# Patient Record
Sex: Female | Born: 1985 | Race: Black or African American | Hispanic: No | Smoking: Never smoker
Health system: Southern US, Community
[De-identification: ages and names within clinical notes are randomized; demographics above are authoritative.]

## PROBLEM LIST (undated history)

## (undated) ENCOUNTER — Inpatient Hospital Stay (HOSPITAL_COMMUNITY): Payer: Self-pay

## (undated) DIAGNOSIS — F32A Depression, unspecified: Secondary | ICD-10-CM

## (undated) DIAGNOSIS — F329 Major depressive disorder, single episode, unspecified: Secondary | ICD-10-CM

## (undated) DIAGNOSIS — F419 Anxiety disorder, unspecified: Secondary | ICD-10-CM

## (undated) DIAGNOSIS — D563 Thalassemia minor: Secondary | ICD-10-CM

## (undated) DIAGNOSIS — O139 Gestational [pregnancy-induced] hypertension without significant proteinuria, unspecified trimester: Secondary | ICD-10-CM

## (undated) DIAGNOSIS — J45909 Unspecified asthma, uncomplicated: Secondary | ICD-10-CM

## (undated) HISTORY — PX: WISDOM TOOTH EXTRACTION: SHX21

---

## 2010-11-21 ENCOUNTER — Inpatient Hospital Stay (INDEPENDENT_AMBULATORY_CARE_PROVIDER_SITE_OTHER)
Admission: RE | Admit: 2010-11-21 | Discharge: 2010-11-21 | Disposition: A | Discharge status: Home / Self Care | Payer: Self-pay | Source: Ambulatory Visit | Attending: Family Medicine | Admitting: Family Medicine

## 2010-11-21 DIAGNOSIS — B86 Scabies: Secondary | ICD-10-CM

## 2011-02-05 ENCOUNTER — Emergency Department (HOSPITAL_COMMUNITY)
Admission: EM | Admit: 2011-02-05 | Discharge: 2011-02-05 | Disposition: A | Discharge status: Home / Self Care | Payer: Self-pay | Attending: Emergency Medicine | Admitting: Emergency Medicine

## 2011-02-05 DIAGNOSIS — R42 Dizziness and giddiness: Secondary | ICD-10-CM | POA: Insufficient documentation

## 2011-02-05 DIAGNOSIS — R5381 Other malaise: Secondary | ICD-10-CM | POA: Insufficient documentation

## 2011-02-05 DIAGNOSIS — R002 Palpitations: Secondary | ICD-10-CM | POA: Insufficient documentation

## 2011-02-05 DIAGNOSIS — R55 Syncope and collapse: Secondary | ICD-10-CM | POA: Insufficient documentation

## 2011-02-05 DIAGNOSIS — R110 Nausea: Secondary | ICD-10-CM | POA: Insufficient documentation

## 2011-02-05 DIAGNOSIS — B9789 Other viral agents as the cause of diseases classified elsewhere: Secondary | ICD-10-CM | POA: Insufficient documentation

## 2011-02-05 DIAGNOSIS — R5383 Other fatigue: Secondary | ICD-10-CM | POA: Insufficient documentation

## 2011-02-05 DIAGNOSIS — R630 Anorexia: Secondary | ICD-10-CM | POA: Insufficient documentation

## 2011-02-05 LAB — URINALYSIS, ROUTINE W REFLEX MICROSCOPIC
Glucose, UA: NEGATIVE mg/dL
Ketones, ur: NEGATIVE mg/dL
Leukocytes, UA: NEGATIVE
pH: 7 (ref 5.0–8.0)

## 2011-02-05 LAB — DIFFERENTIAL
Basophils Relative: 1 % (ref 0–1)
Eosinophils Relative: 5 % (ref 0–5)
Lymphocytes Relative: 33 % (ref 12–46)
Monocytes Relative: 17 % — ABNORMAL HIGH (ref 3–12)
Neutrophils Relative %: 44 % (ref 43–77)

## 2011-02-05 LAB — CBC
HCT: 38.1 % (ref 36.0–46.0)
Hemoglobin: 13.1 g/dL (ref 12.0–15.0)
MCV: 65.8 fL — ABNORMAL LOW (ref 78.0–100.0)
Platelets: 284 10*3/uL (ref 150–400)
RBC: 5.79 MIL/uL — ABNORMAL HIGH (ref 3.87–5.11)
WBC: 3.1 10*3/uL — ABNORMAL LOW (ref 4.0–10.5)

## 2011-02-05 LAB — BASIC METABOLIC PANEL
BUN: 6 mg/dL (ref 6–23)
CO2: 29 mEq/L (ref 19–32)
Chloride: 103 mEq/L (ref 96–112)
Creatinine, Ser: 0.61 mg/dL (ref 0.50–1.10)

## 2011-02-06 LAB — URINE CULTURE: Culture  Setup Time: 201209250221

## 2013-10-29 ENCOUNTER — Emergency Department (HOSPITAL_BASED_OUTPATIENT_CLINIC_OR_DEPARTMENT_OTHER)
Admission: EM | Admit: 2013-10-29 | Discharge: 2013-10-29 | Disposition: A | Discharge status: Home / Self Care | Payer: 59 | Attending: Emergency Medicine | Admitting: Emergency Medicine

## 2013-10-29 ENCOUNTER — Encounter (HOSPITAL_BASED_OUTPATIENT_CLINIC_OR_DEPARTMENT_OTHER): Payer: Self-pay | Admitting: Emergency Medicine

## 2013-10-29 DIAGNOSIS — J45909 Unspecified asthma, uncomplicated: Secondary | ICD-10-CM | POA: Insufficient documentation

## 2013-10-29 DIAGNOSIS — K006 Disturbances in tooth eruption: Secondary | ICD-10-CM | POA: Insufficient documentation

## 2013-10-29 DIAGNOSIS — K029 Dental caries, unspecified: Secondary | ICD-10-CM

## 2013-10-29 HISTORY — DX: Unspecified asthma, uncomplicated: J45.909

## 2013-10-29 MED ORDER — PENICILLIN V POTASSIUM 250 MG PO TABS
500.0000 mg | ORAL_TABLET | Freq: Once | ORAL | Status: AC
  Administered 2013-10-29: 500 mg via ORAL
  Filled 2013-10-29: qty 2

## 2013-10-29 MED ORDER — TRAMADOL HCL 50 MG PO TABS
50.0000 mg | ORAL_TABLET | Freq: Four times a day (QID) | ORAL | Status: DC | PRN
Start: 2013-10-29 — End: 2017-04-12

## 2013-10-29 MED ORDER — PENICILLIN V POTASSIUM 500 MG PO TABS: 500.0000 mg | ORAL_TABLET | Freq: Four times a day (QID) | ORAL | Status: AC

## 2013-10-29 NOTE — ED Provider Notes (Signed)
CSN: [id number]     Arrival date & time 10/29/13  0143 History   First MD Initiated Contact with Patient 10/29/13 0242     Chief Complaint  Patient presents with  . Dental Pain     (Consider location/radiation/quality/duration/timing/severity/associated sxs/prior Treatment) Patient is a 28 y.o. female presenting with tooth pain. The history is provided by the patient.  Dental Pain Location:  Upper Upper teeth location:  1/RU 3rd molar Quality:  Dull Severity:  Severe Onset quality:  Gradual Timing:  Constant Progression:  Unchanged Chronicity:  New Context: dental caries and dental fracture   Previous work-up:  Dental exam Relieved by:  Nothing Worsened by:  Nothing tried Ineffective treatments:  None tried Associated symptoms: no fever and no neck swelling   Risk factors: no alcohol problem     Past Medical History  Diagnosis Date  . Asthma    History reviewed. No pertinent past surgical history. History reviewed. No pertinent family history. History  Substance Use Topics  . Smoking status: Never Smoker   . Smokeless tobacco: Not on file  . Alcohol Use: No   OB History   Grav Para Term Preterm Abortions TAB SAB Ect Mult Living                 Review of Systems  Constitutional: Negative for fever.  All other systems reviewed and are negative.     Allergies  Review of patient's allergies indicates no known allergies.  Home Medications   Prior to Admission medications   Medication Sig Start Date End Date Taking? Authorizing Provider  penicillin v potassium (VEETID) 500 MG tablet Take 1 tablet (500 mg total) by mouth 4 (four) times daily. 10/29/13 11/05/13  Adilynn Bessey K Shaheim Mahar-Rasch, MD  traMADol (ULTRAM) 50 MG tablet Take 1 tablet (50 mg total) by mouth every 6 (six) hours as needed. 10/29/13   Yussef Jorge K Tanganyika Bowlds-Rasch, MD   BP 102/81  Pulse 70  Temp(Src) 98.6 F (37 C) (Oral)  Resp 17  Ht 5\' 6"  (1.676 m)  Wt 134 lb (60.782 kg)  BMI 21.64 kg/m2  SpO2  100% Physical Exam  Constitutional: She is oriented to person, place, and time. She appears well-developed and well-nourished. No distress.  HENT:  Head: Normocephalic and atraumatic.  Mouth/Throat: Oropharynx is clear and moist. No trismus in the jaw. Abnormal dentition. Dental caries present. No uvula swelling.    Eyes: Conjunctivae are normal. Pupils are equal, round, and reactive to light.  Neck: Normal range of motion. Neck supple.  Cardiovascular: Normal rate and regular rhythm.   Pulmonary/Chest: Effort normal and breath sounds normal. No stridor. She has no wheezes. She has no rales.  Abdominal: Soft. Bowel sounds are normal.  Musculoskeletal: Normal range of motion.  Neurological: She is alert and oriented to person, place, and time.  Skin: Skin is warm and dry.  Psychiatric: She has a normal mood and affect.    ED Course  Procedures (including critical care time) Labs Review Labs Reviewed - No data to display  Imaging Review No results found.   EKG Interpretation None      MDM   Final diagnoses:  Dental caries    Will need extractions or wisdom teeth and repair of molars.  Follow up with oral surgery.  Take all antibiotics.  Pain medication given   Andersyn Fragoso K Bassam Dresch-Rasch, MD 10/29/13 [phone]

## 2013-10-29 NOTE — Discharge Instructions (Signed)
Dental Care and Dentist Visits  Dental care supports good overall health. Regular dental visits can also help you avoid dental pain, bleeding, infection, and other more serious health problems in the future. It is important to keep the mouth healthy because diseases in the teeth, gums, and other oral tissues can spread to other areas of the body. Some problems, such as diabetes, heart disease, and pre-term labor have been associated with poor oral health.   See your dentist every 6 months. If you experience emergency problems such as a toothache or broken tooth, go to the dentist right away. If you see your dentist regularly, you may catch problems early. It is easier to be treated for problems in the early stages.   WHAT TO EXPECT AT A DENTIST VISIT   Your dentist will look for many common oral health problems and recommend proper treatment. At your regular dental visit, you can expect:  · Gentle cleaning of the teeth and gums. This includes scraping and polishing. This helps to remove the sticky substance around the teeth and gums (plaque). Plaque forms in the mouth shortly after eating. Over time, plaque hardens on the teeth as tartar. If tartar is not removed regularly, it can cause problems. Cleaning also helps remove stains.  · Periodic X-rays. These pictures of the teeth and supporting bone will help your dentist assess the health of your teeth.  · Periodic fluoride treatments. Fluoride is a natural mineral shown to help strengthen teeth. Fluoride treatment involves applying a fluoride gel or varnish to the teeth. It is most commonly done in children.  · Examination of the mouth, tongue, jaws, teeth, and gums to look for any oral health problems, such as:  ¨ Cavities (dental caries). This is decay on the tooth caused by plaque, sugar, and acid in the mouth. It is best to catch a cavity when it is small.  ¨ Inflammation of the gums caused by plaque buildup (gingivitis).  ¨ Problems with the mouth or malformed  or misaligned teeth.  ¨ Oral cancer or other diseases of the soft tissues or jaws.   KEEP YOUR TEETH AND GUMS HEALTHY  For healthy teeth and gums, follow these general guidelines as well as your dentist's specific advice:  · Have your teeth professionally cleaned at the dentist every 6 months.  · Brush twice daily with a fluoride toothpaste.  · Floss your teeth daily.   · Ask your dentist if you need fluoride supplements, treatments, or fluoride toothpaste.  · Eat a healthy diet. Reduce foods and drinks with added sugar.  · Avoid smoking.  TREATMENT FOR ORAL HEALTH PROBLEMS  If you have oral health problems, treatment varies depending on the conditions present in your teeth and gums.  · Your caregiver will most likely recommend good oral hygiene at each visit.  · For cavities, gingivitis, or other oral health disease, your caregiver will perform a procedure to treat the problem. This is typically done at a separate appointment. Sometimes your caregiver will refer you to another dental specialist for specific tooth problems or for surgery.  SEEK IMMEDIATE DENTAL CARE IF:  · You have pain, bleeding, or soreness in the gum, tooth, jaw, or mouth area.  · A permanent tooth becomes loose or separated from the gum socket.  · You experience a blow or injury to the mouth or jaw area.  Document Released: 01/10/2011 Document Revised: 07/23/2011 Document Reviewed: 01/10/2011  ExitCare® Patient Information ©2015 [employer]. This information is not intended to replace advice   given to you by your health care provider. Make sure you discuss any questions you have with your health care provider.

## 2013-10-29 NOTE — ED Notes (Signed)
Dental pain for several days

## 2015-01-31 DIAGNOSIS — M2550 Pain in unspecified joint: Secondary | ICD-10-CM | POA: Insufficient documentation

## 2015-01-31 DIAGNOSIS — F319 Bipolar disorder, unspecified: Secondary | ICD-10-CM | POA: Insufficient documentation

## 2017-04-12 ENCOUNTER — Ambulatory Visit (INDEPENDENT_AMBULATORY_CARE_PROVIDER_SITE_OTHER): Payer: 59 | Admitting: Family Medicine

## 2017-04-12 ENCOUNTER — Encounter: Payer: Self-pay | Admitting: Family Medicine

## 2017-04-12 VITALS — BP 136/80 | HR 83 | Wt 135.0 lb

## 2017-04-12 DIAGNOSIS — Z3401 Encounter for supervision of normal first pregnancy, first trimester: Secondary | ICD-10-CM

## 2017-04-12 DIAGNOSIS — Z113 Encounter for screening for infections with a predominantly sexual mode of transmission: Secondary | ICD-10-CM | POA: Diagnosis not present

## 2017-04-12 DIAGNOSIS — Z3400 Encounter for supervision of normal first pregnancy, unspecified trimester: Secondary | ICD-10-CM

## 2017-04-12 NOTE — Progress Notes (Signed)
  Subjective:  Hailey FosterDominique Mckenzie is a G1P0 8344w2d being seen today for her first obstetrical visit.  Her obstetrical history is insignificant. FOB is involved and patient's boyfriend. Patient does intend to breast feed. Pregnancy history fully reviewed.  Patient reports right arm tingling, left hip pain..  BP 136/80   Pulse 83   Wt 135 lb (61.2 kg)   LMP 01/23/2017 (Exact Date)   BMI 21.79 kg/m   HISTORY: OB History  Gravida Para Term Preterm AB Living  1            SAB TAB Ectopic Multiple Live Births               # Outcome Date GA Lbr Len/2nd Weight Sex Delivery Anes PTL Lv  1 Current               Past Medical History:  Diagnosis Date  . Asthma     Past Surgical History:  Procedure Laterality Date  . WISDOM TOOTH EXTRACTION      Family History  Problem Relation Age of Onset  . Hypertension Mother   . Asthma Father   . Hypertension Maternal Grandmother   . Cancer Neg Hx   . Diabetes Neg Hx   . Stroke Neg Hx      Exam    Uterus:     Pelvic Exam:    Perineum: No Hemorrhoids, Normal Perineum   Vulva: normal, Bartholin's, Urethra, Skene's normal   Vagina:  normal mucosa   Cervix: no cervical motion tenderness, no lesions and nulliparous appearance   Adnexa: normal adnexa and no mass, fullness, tenderness   Bony Pelvis: gynecoid  System: Breast:  normal appearance, no masses or tenderness, Inspection negative, No nipple retraction or dimpling, No nipple discharge or bleeding, No axillary or supraclavicular adenopathy, Normal to palpation without dominant masses   Skin: normal coloration and turgor, no rashes    Neurologic: gait normal; reflexes normal and symmetric   Extremities: normal strength, tone, and muscle mass, no deformities, no erythema, induration, or nodules   HEENT PERRLA and extra ocular movement intact   Mouth/Teeth mucous membranes moist, pharynx normal without lesions   Neck supple and no masses   Cardiovascular: regular rate and rhythm, no  murmurs or gallops   Respiratory:  appears well, vitals normal, no respiratory distress, acyanotic, normal RR, ear and throat exam is normal, neck free of mass or lymphadenopathy, chest clear, no wheezing, crepitations, rhonchi, normal symmetric air entry   Abdomen: soft, non-tender; bowel sounds normal; no masses,  no organomegaly   Urinary: urethral meatus normal      Assessment:    Pregnancy: G1P0 Patient Active Problem List   Diagnosis Date Noted  . Supervision of normal first pregnancy, antepartum 04/12/2017      Plan:   1. Supervision of normal first pregnancy, antepartum Genetic Screening discussed Quad Screen: undecided.  Ultrasound discussed; fetal survey: ordered.  Follow up in 8 weeks.   Problem list reviewed and updated. 75% of 30 min visit spent on counseling and coordination of care.     Levie HeritageJacob J Gerhard Rappaport 04/12/2017

## 2017-04-12 NOTE — Progress Notes (Signed)
DATING AND VIABILITY SONOGRAM   Hailey FosterDominique Mckenzie is a 31 y.o. year old G1P0 with LMP Patient's last menstrual period was 01/23/2017 (exact date). which would correlate to  5645w2d weeks gestation.  She has regular menstrual cycles.   She is here today for a confirmatory initial sonogram.    GESTATION: SINGLETON     FETAL ACTIVITY:          Heart rate         156          The fetus is active.  PLACENTA LOCALIZATION:  anterior   GESTATIONAL AGE AND  BIOMETRICS:  Gestational criteria: Estimated Date of Delivery: 10/30/17 by LMP now at 4445w2d  Previous Scans:0      Crown RUMP 4.37 cm 11-1 weeks                                                                               AVERAGE EGA(BY THIS SCAN):11-1 weeks  WORKING EDD( early ultrasound ):  10-30-17  Hailey StammerJennifer Clemens Lachman RN BSN

## 2017-04-13 LAB — OBSTETRIC PANEL, INCLUDING HIV
ANTIBODY SCREEN: NEGATIVE
BASOS: 0 %
Basophils Absolute: 0 10*3/uL (ref 0.0–0.2)
EOS (ABSOLUTE): 0.1 10*3/uL (ref 0.0–0.4)
Eos: 2 %
HEMATOCRIT: 33.6 % — AB (ref 34.0–46.6)
HEMOGLOBIN: 11.4 g/dL (ref 11.1–15.9)
HEP B S AG: NEGATIVE
HIV Screen 4th Generation wRfx: NONREACTIVE
IMMATURE GRANULOCYTES: 0 %
Immature Grans (Abs): 0 10*3/uL (ref 0.0–0.1)
LYMPHS: 22 %
Lymphocytes Absolute: 1.4 10*3/uL (ref 0.7–3.1)
MCH: 22.1 pg — ABNORMAL LOW (ref 26.6–33.0)
MCHC: 33.9 g/dL (ref 31.5–35.7)
MCV: 65 fL — ABNORMAL LOW (ref 79–97)
MONOCYTES: 10 %
Monocytes Absolute: 0.6 10*3/uL (ref 0.1–0.9)
NEUTROS PCT: 66 %
Neutrophils Absolute: 4 10*3/uL (ref 1.4–7.0)
Platelets: 262 10*3/uL (ref 150–379)
RBC: 5.17 x10E6/uL (ref 3.77–5.28)
RDW: 16.7 % — ABNORMAL HIGH (ref 12.3–15.4)
RH TYPE: POSITIVE
RPR: NONREACTIVE
RUBELLA: 9.43 {index} (ref >0.99)
WBC: 6.1 10*3/uL (ref 3.4–10.8)

## 2017-04-13 LAB — VITAMIN D 25 HYDROXY (VIT D DEFICIENCY, FRACTURES): Vit D, 25-Hydroxy: 8.2 ng/mL — ABNORMAL LOW (ref 30.0–100.0)

## 2017-04-14 LAB — URINE CULTURE: Organism ID, Bacteria: NO GROWTH

## 2017-04-15 ENCOUNTER — Other Ambulatory Visit: Payer: Self-pay | Admitting: Family Medicine

## 2017-04-15 ENCOUNTER — Telehealth: Payer: Self-pay

## 2017-04-15 DIAGNOSIS — Z3400 Encounter for supervision of normal first pregnancy, unspecified trimester: Secondary | ICD-10-CM

## 2017-04-15 MED ORDER — VITAMIN D (ERGOCALCIFEROL) 1.25 MG (50000 UNIT) PO CAPS: 50000.0000 [IU] | ORAL_CAPSULE | ORAL | 0 refills | Status: DC

## 2017-04-15 NOTE — Telephone Encounter (Signed)
Attempted to reach patient to make her aware of Vitamin D result and order from Dr. Adrian Blackwaterstinson Vitamin D 50,000 units.   Unable to reach patient due to busy signal. Armandina StammerJennifer Kijana Estock RNBSN

## 2017-04-15 NOTE — Progress Notes (Signed)
Patient has extremely low vitamin D levels. Supplementation recommended: 50,000 IU once a week. No pharmacy listed. Please call patient and send prescription.

## 2017-04-16 LAB — GC/CHLAMYDIA PROBE AMP (~~LOC~~) NOT AT ARMC
CHLAMYDIA, DNA PROBE: NEGATIVE
Neisseria Gonorrhea: NEGATIVE

## 2017-05-29 ENCOUNTER — Encounter (HOSPITAL_COMMUNITY): Payer: Self-pay | Admitting: Family Medicine

## 2017-06-05 ENCOUNTER — Ambulatory Visit (HOSPITAL_COMMUNITY): Payer: 59

## 2017-06-07 ENCOUNTER — Ambulatory Visit (INDEPENDENT_AMBULATORY_CARE_PROVIDER_SITE_OTHER): Payer: 59 | Admitting: Family Medicine

## 2017-06-07 VITALS — BP 122/75 | HR 101 | Wt 136.0 lb

## 2017-06-07 DIAGNOSIS — Z3490 Encounter for supervision of normal pregnancy, unspecified, unspecified trimester: Secondary | ICD-10-CM

## 2017-06-07 DIAGNOSIS — Z3400 Encounter for supervision of normal first pregnancy, unspecified trimester: Secondary | ICD-10-CM

## 2017-06-07 MED ORDER — VITAMIN D (ERGOCALCIFEROL) 1.25 MG (50000 UNIT) PO CAPS: 50000.0000 [IU] | ORAL_CAPSULE | ORAL | 0 refills | Status: DC

## 2017-06-07 NOTE — Patient Instructions (Signed)

## 2017-06-07 NOTE — Progress Notes (Signed)
   PRENATAL VISIT NOTE  Subjective:  Hailey Mckenzie is a 32 y.o. G1P0 at 7082w2d being seen today for ongoing prenatal care.  She is currently monitored for the following issues for this low-risk pregnancy and has Supervision of normal first pregnancy, antepartum on their problem list.  Patient reports sinus congestion starting a couple days ago..  Contractions: Not present. Vag. Bleeding: None.   . Denies leaking of fluid.   The following portions of the patient's history were reviewed and updated as appropriate: allergies, current medications, past family history, past medical history, past social history, past surgical history and problem list. Problem list updated.  Objective:   Vitals:   06/07/17 0952  BP: 122/75  Pulse: (!) 101  Weight: 136 lb (61.7 kg)    Fetal Status: Fetal Heart Rate (bpm): 152         General:  Alert, oriented and cooperative. Patient is in no acute distress.  Skin: Skin is warm and dry. No rash noted.   Cardiovascular: Normal heart rate noted  Respiratory: Normal respiratory effort, no problems with respiration noted  Abdomen: Soft, gravid, appropriate for gestational age.  Pain/Pressure: Absent     Pelvic: Cervical exam deferred        Extremities: Normal range of motion.  Edema: None  Mental Status:  Normal mood and affect. Normal behavior. Normal judgment and thought content.   Assessment and Plan:  Pregnancy: G1P0 at 5582w2d  1. Supervision of normal first pregnancy, antepartum FHT and FH normal. US Tuesday.  A list of OTC cold meds given.  2. Prenatal care, antepartum  - Babyscripts Schedule Optimization  Preterm labor symptoms and general obstetric precautions including but not limited to vaginal bleeding, contractions, leaking of fluid and fetal movement were reviewed in detail with the patient. Please refer to After Visit Summary for other counseling recommendations.  Return in about 8 weeks (around 08/02/2017).   Levie HeritageJacob J Vollie Aaron, DO

## 2017-06-07 NOTE — Addendum Note (Signed)
Addended by: Anell BarrHOWARD, JENNIFER L on: 06/07/2017 10:44 AM   Modules accepted: Orders

## 2017-06-07 NOTE — Addendum Note (Signed)
Addended by: Levie HeritageSTINSON, Ryna Beckstrom J on: 06/07/2017 10:37 AM   Modules accepted: Orders

## 2017-06-11 ENCOUNTER — Other Ambulatory Visit: Payer: Self-pay | Admitting: Family Medicine

## 2017-06-11 ENCOUNTER — Ambulatory Visit (HOSPITAL_COMMUNITY)
Admission: RE | Admit: 2017-06-11 | Discharge: 2017-06-11 | Disposition: A | Discharge status: Home / Self Care | Payer: 59 | Source: Ambulatory Visit | Attending: Family Medicine | Admitting: Family Medicine

## 2017-06-11 DIAGNOSIS — Z3A19 19 weeks gestation of pregnancy: Secondary | ICD-10-CM

## 2017-06-11 DIAGNOSIS — Z363 Encounter for antenatal screening for malformations: Secondary | ICD-10-CM

## 2017-06-11 DIAGNOSIS — Z3400 Encounter for supervision of normal first pregnancy, unspecified trimester: Secondary | ICD-10-CM

## 2017-08-02 ENCOUNTER — Ambulatory Visit (INDEPENDENT_AMBULATORY_CARE_PROVIDER_SITE_OTHER): Payer: 59 | Admitting: Family Medicine

## 2017-08-02 VITALS — BP 97/53 | HR 66 | Wt 141.0 lb

## 2017-08-02 DIAGNOSIS — Z3400 Encounter for supervision of normal first pregnancy, unspecified trimester: Secondary | ICD-10-CM

## 2017-08-02 NOTE — Progress Notes (Signed)
   PRENATAL VISIT NOTE  Subjective:  Hailey Mckenzie is a 32 y.o. G1P0 at 1664w2d being seen today for ongoing prenatal care.  She is currently monitored for the following issues for this low-risk pregnancy and has Supervision of normal first pregnancy, antepartum on their problem list.  Patient reports no complaints.  Contractions: Not present. Vag. Bleeding: None.  Movement: Present. Denies leaking of fluid.   The following portions of the patient's history were reviewed and updated as appropriate: allergies, current medications, past family history, past medical history, past social history, past surgical history and problem list. Problem list updated.  Objective:   Vitals:   08/02/17 1030  BP: (!) 97/53  Pulse: 66  Weight: 141 lb (64 kg)    Fetal Status: Fetal Heart Rate (bpm): 155 Fundal Height: 28 cm Movement: Present     General:  Alert, oriented and cooperative. Patient is in no acute distress.  Skin: Skin is warm and dry. No rash noted.   Cardiovascular: Normal heart rate noted  Respiratory: Normal respiratory effort, no problems with respiration noted  Abdomen: Soft, gravid, appropriate for gestational age.  Pain/Pressure: Absent     Pelvic: Cervical exam deferred        Extremities: Normal range of motion.     Mental Status:  Normal mood and affect. Normal behavior. Normal judgment and thought content.   Assessment and Plan:  Pregnancy: G1P0 at 8464w2d  1. Supervision of normal first pregnancy, antepartum F/u US for heart views. Return next week ([place]) for 28 wk labs. - US MFM OB FOLLOW UP; Future  Preterm labor symptoms and general obstetric precautions including but not limited to vaginal bleeding, contractions, leaking of fluid and fetal movement were reviewed in detail with the patient. Please refer to After Visit Summary for other counseling recommendations.  Return in about 1 month (around 08/30/2017) for OB f/u.   Levie HeritageJacob J Stinson, DO

## 2017-08-07 ENCOUNTER — Other Ambulatory Visit: Payer: 59

## 2017-08-07 DIAGNOSIS — Z3490 Encounter for supervision of normal pregnancy, unspecified, unspecified trimester: Secondary | ICD-10-CM

## 2017-08-07 NOTE — Progress Notes (Signed)
Patient for lab only visit. Sent to lab. Armandina StammerJennifer Kiosha Buchan RNBSN

## 2017-08-08 ENCOUNTER — Encounter: Payer: Self-pay | Admitting: Family Medicine

## 2017-08-08 LAB — GLUCOSE TOLERANCE, 2 HOURS W/ 1HR
GLUCOSE, 1 HOUR: 124 mg/dL (ref 65–179)
GLUCOSE, 2 HOUR: 98 mg/dL (ref 65–152)
Glucose, Fasting: 78 mg/dL (ref 65–91)

## 2017-08-08 LAB — CBC
Hematocrit: 34.5 % (ref 34.0–46.6)
Hemoglobin: 11.4 g/dL (ref 11.1–15.9)
MCH: 23.1 pg — ABNORMAL LOW (ref 26.6–33.0)
MCHC: 33 g/dL (ref 31.5–35.7)
MCV: 70 fL — ABNORMAL LOW (ref 79–97)
Platelets: 218 10*3/uL (ref 150–379)
RBC: 4.93 x10E6/uL (ref 3.77–5.28)
RDW: 16.3 % — ABNORMAL HIGH (ref 12.3–15.4)
WBC: 7.2 10*3/uL (ref 3.4–10.8)

## 2017-08-08 LAB — RPR: RPR Ser Ql: NONREACTIVE

## 2017-08-08 LAB — HIV ANTIBODY (ROUTINE TESTING W REFLEX): HIV SCREEN 4TH GENERATION: NONREACTIVE

## 2017-08-15 ENCOUNTER — Ambulatory Visit (HOSPITAL_COMMUNITY): Payer: 59

## 2017-09-03 ENCOUNTER — Inpatient Hospital Stay (HOSPITAL_COMMUNITY)
Admission: AD | Admit: 2017-09-03 | Discharge: 2017-09-03 | Disposition: A | Discharge status: Home / Self Care | Payer: 59 | Source: Ambulatory Visit | Attending: Obstetrics & Gynecology | Admitting: Obstetrics & Gynecology

## 2017-09-03 ENCOUNTER — Encounter (HOSPITAL_COMMUNITY): Payer: Self-pay

## 2017-09-03 ENCOUNTER — Ambulatory Visit (INDEPENDENT_AMBULATORY_CARE_PROVIDER_SITE_OTHER): Payer: 59 | Admitting: Advanced Practice Midwife

## 2017-09-03 ENCOUNTER — Ambulatory Visit: Payer: 59

## 2017-09-03 VITALS — BP 130/88 | HR 79 | Wt 150.0 lb

## 2017-09-03 DIAGNOSIS — O3433 Maternal care for cervical incompetence, third trimester: Secondary | ICD-10-CM

## 2017-09-03 DIAGNOSIS — R109 Unspecified abdominal pain: Secondary | ICD-10-CM

## 2017-09-03 DIAGNOSIS — J45909 Unspecified asthma, uncomplicated: Secondary | ICD-10-CM | POA: Diagnosis not present

## 2017-09-03 DIAGNOSIS — Z3A31 31 weeks gestation of pregnancy: Secondary | ICD-10-CM | POA: Insufficient documentation

## 2017-09-03 DIAGNOSIS — O26893 Other specified pregnancy related conditions, third trimester: Secondary | ICD-10-CM

## 2017-09-03 DIAGNOSIS — O99513 Diseases of the respiratory system complicating pregnancy, third trimester: Secondary | ICD-10-CM | POA: Diagnosis not present

## 2017-09-03 DIAGNOSIS — Z3400 Encounter for supervision of normal first pregnancy, unspecified trimester: Secondary | ICD-10-CM

## 2017-09-03 HISTORY — DX: Thalassemia minor: D56.3

## 2017-09-03 LAB — URINALYSIS, ROUTINE W REFLEX MICROSCOPIC
Bilirubin Urine: NEGATIVE
Glucose, UA: NEGATIVE mg/dL
Hgb urine dipstick: NEGATIVE
KETONES UR: 5 mg/dL — AB
Nitrite: NEGATIVE
PH: 5 (ref 5.0–8.0)
Protein, ur: NEGATIVE mg/dL
Specific Gravity, Urine: 1.028 (ref 1.005–1.030)

## 2017-09-03 MED ORDER — BETAMETHASONE SOD PHOS & ACET 6 (3-3) MG/ML IJ SUSP
12.0000 mg | Freq: Once | INTRAMUSCULAR | Status: AC
  Administered 2017-09-03: 12 mg via INTRAMUSCULAR
  Filled 2017-09-03: qty 2

## 2017-09-03 MED ORDER — LACTATED RINGERS IV BOLUS: 1000.0000 mL | Freq: Once | INTRAVENOUS | Status: DC

## 2017-09-03 NOTE — MAU Note (Addendum)
Pt sent from office for further monitoring. Pt feeling pressure.

## 2017-09-03 NOTE — Progress Notes (Signed)
   PRENATAL VISIT NOTE  Subjective:  Linward FosterDominique Groft is a 32 y.o. G1P0 at 7864w6d being seen today for ongoing prenatal care.  She is currently monitored for the following issues for this low-risk pregnancy and has Supervision of normal first pregnancy, antepartum on their problem list.  Patient reports backache and contractions since 09/01/17.  Contractions: Not present. Vag. Bleeding: None.  Movement: Present. Denies leaking of fluid.   The following portions of the patient's history were reviewed and updated as appropriate: allergies, current medications, past family history, past medical history, past social history, past surgical history and problem list. Problem list updated.  Objective:   Vitals:   09/03/17 1007  BP: 130/88  Pulse: 79  Weight: 150 lb (68 kg)    Fetal Status: Fetal Heart Rate (bpm): 155   Movement: Present  Presentation: Vertex  General:  Alert, oriented and cooperative. Patient is in no acute distress.  Skin: Skin is warm and dry. No rash noted.   Cardiovascular: Normal heart rate noted  Respiratory: Normal respiratory effort, no problems with respiration noted  Abdomen: Soft, gravid, appropriate for gestational age.  Pain/Pressure: Present     Pelvic: Cervical exam performed Dilation: 1 Effacement (%): 50 Station: -3  Extremities: Normal range of motion.  Edema: None  Mental Status: Normal mood and affect. Normal behavior. Normal judgment and thought content.   Assessment and Plan:  Pregnancy: G1P0 at 4764w6d  1. Supervision of normal first pregnancy, antepartum --Reviewed anatomy US with recommended follow up for cardiac/outflow tracts --Pt declines f/u US due to cost with her insurance, all other anatomy wnl  2. Abdominal pain during pregnancy in third trimester --Pt reports cramping and lower abdominal aching x 2 days. She stayed in bed all day yesterday due to pain.  She has tried drinking water, resting, but pain persists.  It is not regular but  generalized and uncomfortable. --Cervix 1/50/-3 --[facility] evaluation of threatened preterm labor    Preterm labor symptoms and general obstetric precautions including but not limited to vaginal bleeding, contractions, leaking of fluid and fetal movement were reviewed in detail with the patient. Please refer to After Visit Summary for other counseling recommendations.  Return in about 2 weeks (around 09/17/2017).  Future Appointments  Date Time Provider Department Center  09/17/2017 10:45 AM Dorathy KinsmanSmith, [place], CNM [facility] None    Sharen CounterLisa Leftwich-Kirby, [place]

## 2017-09-03 NOTE — Patient Instructions (Signed)

## 2017-09-03 NOTE — MAU Note (Signed)
Pt give pitcher of water to PO hydrate

## 2017-09-03 NOTE — MAU Note (Signed)
Urine in lab 

## 2017-09-03 NOTE — Progress Notes (Signed)
Pt c/o lower abdominal pain

## 2017-09-03 NOTE — MAU Provider Note (Signed)
History     CSN: [id number]  Arrival date and time: 09/03/17 1142   First Provider Initiated Contact with Patient 09/03/17 1221      Chief Complaint  Patient presents with  . Abdominal Pain   HPI [name] is a 32 y.o. G1P0 at 329w6d who presents to MAU today from the office for further evaluation of possible preterm labor. The patient states she has been having dull, cramping pain for a couple of days that was improved today. She was checked at her routine prenatal visit and found to be 1/50%. She denies vaginal bleeding, LOF. She reports normal fetal movement.   OB History    Gravida  1   Para      Term      Preterm      AB      Living        SAB      TAB      Ectopic      Multiple      Live Births              Past Medical History:  Diagnosis Date  . Asthma   . Thalassemia trait     Past Surgical History:  Procedure Laterality Date  . WISDOM TOOTH EXTRACTION      Family History  Problem Relation Age of Onset  . Hypertension Mother   . Asthma Father   . Hypertension Maternal Grandmother   . Cancer Neg Hx   . Diabetes Neg Hx   . Stroke Neg Hx     Social History   Tobacco Use  . Smoking status: Never Smoker  . Smokeless tobacco: Never Used  Substance Use Topics  . Alcohol use: Yes    Alcohol/week: 0.6 oz    Types: 1 Glasses of wine per week  . Drug use: No    Allergies: No Known Allergies  No medications prior to admission.    Review of Systems  Constitutional: Negative for fever.  Gastrointestinal: Positive for abdominal pain. Negative for constipation, diarrhea, nausea and vomiting.  Genitourinary: Negative for vaginal bleeding and vaginal discharge.   Physical Exam   Blood pressure 122/90, pulse 79, temperature 98.4 F (36.9 C), temperature source Oral, resp. rate 16, weight 151 lb (68.5 kg), last menstrual period 01/23/2017, SpO2 99 %.  Physical Exam  Nursing note and vitals reviewed. Constitutional: She is  oriented to person, place, and time. She appears well-developed and well-nourished. No distress.  HENT:  Head: Normocephalic and atraumatic.  Cardiovascular: Normal rate.  Respiratory: Effort normal.  GI: Soft. She exhibits no distension and no mass. There is no tenderness. There is no rebound and no guarding.  Neurological: She is alert and oriented to person, place, and time.  Skin: Skin is warm and dry. No erythema.  Psychiatric: She has a normal mood and affect.  Dilation: 1.5 Effacement (%): 50 Station: -3 Presentation: Vertex Exam by:: Harlon FlorJ Danise Dehne NP   Results for orders placed or performed during the hospital encounter of 09/03/17 (from the past 24 hour(s))  Urinalysis, Routine w reflex microscopic     Status: Abnormal   Collection Time: 09/03/17 11:48 AM  Result Value Ref Range   Color, Urine YELLOW YELLOW   APPearance HAZY (A) CLEAR   Specific Gravity, Urine 1.028 1.005 - 1.030   pH 5.0 5.0 - 8.0   Glucose, UA NEGATIVE NEGATIVE mg/dL   Hgb urine dipstick NEGATIVE NEGATIVE   Bilirubin Urine NEGATIVE NEGATIVE  Ketones, ur 5 (A) NEGATIVE mg/dL   Protein, ur NEGATIVE NEGATIVE mg/dL   Nitrite NEGATIVE NEGATIVE   Leukocytes, UA SMALL (A) NEGATIVE   RBC / HPF 0-5 0 - 5 RBC/hpf   WBC, UA 6-10 0 - 5 WBC/hpf   Bacteria, UA RARE (A) NONE SEEN   Squamous Epithelial / LPF 11-20 0 - 5   Mucus PRESENT    Ca Oxalate Crys, UA PRESENT    Fetal Monitoring: Baseline: 120 bpm Variability: moderate Accelerations: 10 x 10 Decelerations: none Contractions: none   MAU Course  Procedures None  MDM CNM called from the office. Patient having cramping for a few days. No FFN available. Checked in the office 1/50 posterior.  No contractions noted on TOCO. Cervix unchanged after 1 hour. BMZ given today. Will return to [facility] for second dose tomorrow.   Assessment and Plan  A: SIUP at [redacted]w[redacted]d Premature cervical dilation   P: Discharge home Preterm labor precautions discussed Patient  advised to follow-up with [facility] for 2nd BMZ tomorrow afternoon and then return to [facility] for routine prenatal care Patient may return to MAU as needed or if her condition were to change or worsen  Vonzella Nipple, PA-C 09/03/2017, 2:49 PM

## 2017-09-03 NOTE — Discharge Instructions (Signed)
Pelvic Rest °Pelvic rest may be recommended if: °· Your placenta is partially or completely covering the opening of your cervix (placenta previa). °· There is bleeding between the wall of the uterus and the amniotic sac in the first trimester of pregnancy (subchorionic hemorrhage). °· You went into labor too early (preterm labor). ° °Based on your overall health and the health of your baby, your health care provider will decide if pelvic rest is right for you. °How do I rest my pelvis? °For as long as told by your health care provider: °· Do not have sex, sexual stimulation, or an orgasm. °· Do not use tampons. Do not douche. Do not put anything in your vagina. °· Do not lift anything that is heavier than 10 lb (4.5 kg). °· Avoid activities that take a lot of effort (are strenuous). °· Avoid any activity in which your pelvic muscles could become strained. ° °When should I seek medical care? °Seek medical care if you have: °· Cramping pain in your lower abdomen. °· Vaginal discharge. °· A low, dull backache. °· Regular contractions. °· Uterine tightening. ° °When should I seek immediate medical care? °Seek immediate medical care if: °· You have vaginal bleeding and you are pregnant. ° °This information is not intended to replace advice given to you by your health care provider. Make sure you discuss any questions you have with your health care provider. °Document Released: 08/25/2010 Document Revised: 10/06/2015 Document Reviewed: 11/01/2014 °Elsevier Interactive Patient Education © 2018 [employer] ° °

## 2017-09-04 ENCOUNTER — Ambulatory Visit (INDEPENDENT_AMBULATORY_CARE_PROVIDER_SITE_OTHER): Payer: 59 | Admitting: General Practice

## 2017-09-04 MED ORDER — BETAMETHASONE SOD PHOS & ACET 6 (3-3) MG/ML IJ SUSP
12.0000 mg | Freq: Once | INTRAMUSCULAR | Status: AC
  Administered 2017-09-04: 12 mg via INTRAMUSCULAR

## 2017-09-04 NOTE — Progress Notes (Signed)
Chart reviewed for nurse visit. Agree with plan of care.   Judeth HornLawrence, Pardeep Pautz, NP 09/04/2017 5:00 PM

## 2017-09-11 ENCOUNTER — Inpatient Hospital Stay (HOSPITAL_COMMUNITY)
Admission: AD | Admit: 2017-09-11 | Discharge: 2017-09-12 | Disposition: A | Discharge status: Home / Self Care | Payer: 59 | Source: Ambulatory Visit | Attending: Obstetrics & Gynecology | Admitting: Obstetrics & Gynecology

## 2017-09-11 ENCOUNTER — Encounter: Payer: Self-pay | Admitting: Family Medicine

## 2017-09-11 DIAGNOSIS — R030 Elevated blood-pressure reading, without diagnosis of hypertension: Secondary | ICD-10-CM | POA: Insufficient documentation

## 2017-09-11 DIAGNOSIS — Z8669 Personal history of other diseases of the nervous system and sense organs: Secondary | ICD-10-CM

## 2017-09-11 DIAGNOSIS — Z8759 Personal history of other complications of pregnancy, childbirth and the puerperium: Secondary | ICD-10-CM

## 2017-09-11 DIAGNOSIS — R51 Headache: Secondary | ICD-10-CM | POA: Diagnosis present

## 2017-09-11 DIAGNOSIS — Z3A33 33 weeks gestation of pregnancy: Secondary | ICD-10-CM | POA: Diagnosis not present

## 2017-09-11 DIAGNOSIS — O26893 Other specified pregnancy related conditions, third trimester: Secondary | ICD-10-CM | POA: Insufficient documentation

## 2017-09-11 LAB — URINALYSIS, ROUTINE W REFLEX MICROSCOPIC
Bilirubin Urine: NEGATIVE
Glucose, UA: NEGATIVE mg/dL
Hgb urine dipstick: NEGATIVE
Ketones, ur: NEGATIVE mg/dL
LEUKOCYTES UA: NEGATIVE
NITRITE: NEGATIVE
PROTEIN: NEGATIVE mg/dL
Specific Gravity, Urine: 1.004 — ABNORMAL LOW (ref 1.005–1.030)
pH: 7 (ref 5.0–8.0)

## 2017-09-11 MED ORDER — BUTALBITAL-APAP-CAFFEINE 50-325-40 MG PO TABS
2.0000 | ORAL_TABLET | Freq: Once | ORAL | Status: AC
  Administered 2017-09-11: 2 via ORAL
  Filled 2017-09-11: qty 2

## 2017-09-11 NOTE — MAU Note (Signed)
Pt here with reports of migraine since yesterday. States that it is getting worse. Has a history of migraines and they usually do not last over night. Pt states she took half a pack of goodie powder but has not helped. States she checked her BP at home and it was 132/90 and rechecked again and it was 127/92. No history of HBP. Pt denies vaginal bleeding, LOF or contractions. Reprots good fetal movement.

## 2017-09-11 NOTE — MAU Provider Note (Addendum)
History     CSN: [id number]  Arrival date and time: 09/11/17 2209   First Provider Initiated Contact with Patient 09/11/17 2318      Chief Complaint  Patient presents with  . Hypertension  . Headache   This is a 32 yo G1P0 at 33 weeks who presents to the MAU with a headache.   The patient has a history of migraine headaches, occurring about one time per month and usually associated with an aura. Yesterday she started having her current headache. This headache is described as being frontal, bilateral and persistent since about 1 PM today. Yesterday the headache was more intermittent and improved after taking Goody's Powder. Today, her headache has not responded to the Goody's powder. She had one episode of seeing a couple of spots, but none since. No other numbness, tingling, or extremity weakness. She checked her blood pressure around 7PM and found that her blood pressure was elevated to the 130's/90s, which was concerning to her. No blood pressure concerns throughout the pregnancy.   Of note, the patient was seen in the MAU one week ago with concerns for preterm labor. No regular contractions since that time. She endorses normal fetal movement without leakage of fluid or vaginal bleeding.   Hypertension  Associated symptoms include headaches. Pertinent negatives include no chest pain or shortness of breath.  Headache   Pertinent negatives include no abdominal pain, coughing, dizziness, fever, nausea, numbness, rhinorrhea, vomiting or weakness. Her past medical history is significant for hypertension.    Past Medical History:  Diagnosis Date  . Asthma   . Thalassemia trait     Past Surgical History:  Procedure Laterality Date  . WISDOM TOOTH EXTRACTION      Family History  Problem Relation Age of Onset  . Hypertension Mother   . Asthma Father   . Hypertension Maternal Grandmother   . Cancer Neg Hx   . Diabetes Neg Hx   . Stroke Neg Hx     Social History   Tobacco Use   . Smoking status: Never Smoker  . Smokeless tobacco: Never Used  Substance Use Topics  . Alcohol use: Yes    Alcohol/week: 0.6 oz    Types: 1 Glasses of wine per week  . Drug use: No    Allergies: No Known Allergies  Medications Prior to Admission  Medication Sig Dispense Refill Last Dose  . prenatal vitamin w/FE, FA (PRENATAL 1 + 1) 27-1 MG TABS tablet Take 1 tablet by mouth daily at 12 noon.   09/03/2017 at Unknown time  . Vitamin D, Ergocalciferol, (DRISDOL) 50000 units CAPS capsule Take 1 capsule (50,000 Units total) by mouth every 7 (seven) days. 30 capsule 0 Past Week at Unknown time    Review of Systems  Constitutional: Negative for chills and fever.  HENT: Negative for congestion, rhinorrhea and sinus pain.   Respiratory: Negative for cough and shortness of breath.   Cardiovascular: Negative for chest pain and leg swelling.  Gastrointestinal: Negative for abdominal pain, nausea and vomiting.  Genitourinary: Negative for dysuria.  Neurological: Positive for headaches. Negative for dizziness, weakness and numbness.   Physical Exam   Blood pressure 108/64, pulse 66, temperature 98.7 F (37.1 C), temperature source Oral, resp. rate 18, height  (1.676 m), weight 70.3 kg (155 lb), last menstrual period 01/23/2017, SpO2 99 %.  Physical Exam  Constitutional: She is oriented to person, place, and time. She appears well-developed and well-nourished.  HENT:  Head: Normocephalic.  Eyes: Pupils  are equal, round, and reactive to light. Conjunctivae and EOM are normal.  Neck: Normal range of motion. Neck supple.  Cardiovascular: Normal rate, regular rhythm, normal heart sounds and intact distal pulses.  Respiratory: Effort normal and breath sounds normal.  GI:  Gravid and appropriate for gestational age  Musculoskeletal: Normal range of motion. She exhibits no edema.  Neurological: She is alert and oriented to person, place, and time. No cranial nerve deficit. She exhibits  normal muscle tone. Coordination normal.  Skin: Skin is warm and dry.  Psychiatric: Her behavior is normal.   FHT: Baseline 135, +accels, -decels, moderate variability. Cat I. Reassuring.  Toco: no uterine contractions, minimal to no uterine irritability  MAU Course  Procedures  MDM Patient is a 31yo G1P0 at 33 weeks who presents today with a headache and concern for an elevated blood pressure at home. The patient's headache occurred prior to the elevated blood pressure. Her blood pressure in the MAU has been within normal limits. Do not suspect gHTN or pre-eclampsia at this time given the time line. Suspect that the patient had an elevated blood pressure in response to the pain associated with her headache. Given her history of migraines and description of the headache, will trial medical management of headache at this time. Provided Fioricet for pain control.   Patient was re-evaluated after receiving the Fioricet and reported her headache was gone and she was comfortable going home. Blood pressure remained well-controlled.   Assessment and Plan  31yo G1P0 at 33+1 weeks who presented with a persistent headache, and one mildly elevated blood pressure at home (130s/90s). Suspect that current headache is related to a migraine vs tension headache. Blood pressure within normal limits while here, no concern for gHTN or pre-eclampsia at this time.   Patient discharged home Discussed prevention of migraines with lifestyle changes Provided Rx for Fioricet. Plan to take extra strength tylenol at first sign of recurrent headache. If this does not work, then should try additional dose of Fioricet.  Labor precautions were reviewed with the patient.  FHT Cat I; Reassuring  Follow-up with OB provider as previously scheduled.   Gorden Harms 09/11/2017, 11:27 PM   OB FELLOW MAU DISCHARGE ATTESTATION  I have seen and examined this patient; I agree with above documentation in the resident's note.    32 y.o. G1P0 with IUP at [redacted]w[redacted]d and hx of migraine p/w headache and concern of elevated BP at home.   Patient Vitals for the past 24 hrs:  BP Temp Temp src Pulse Resp SpO2 Height Weight  09/12/17 0000 117/62 - - (!) 58 - 100 % - -  09/11/17 2340 124/87 - - 68 - 99 % - -  09/11/17 2300 108/64 - - 66 - 99 % - -  09/11/17 2224 128/80 98.7 F (37.1 C) Oral 71 18 100 %  (1.676 m) 155 lb (70.3 kg)   All BPs here wnl.  Reactive NST.  Fioricet given, with resolution of symptoms.  Discussed headache preventive mesasures Rx for Fioricet prn  Return precautions Keep PNV in 5 days  Frederik Pear, MD OB Fellow 12:52 AM  Future Appointments  Date Time Provider Department Center  09/17/2017 10:45 AM Dorathy Kinsman, CNM [facility] None

## 2017-09-12 DIAGNOSIS — R51 Headache: Secondary | ICD-10-CM

## 2017-09-12 DIAGNOSIS — O26893 Other specified pregnancy related conditions, third trimester: Secondary | ICD-10-CM

## 2017-09-12 MED ORDER — BUTALBITAL-APAP-CAFFEINE 50-325-40 MG PO TABS: 1.0000 | ORAL_TABLET | Freq: Four times a day (QID) | ORAL | 0 refills | Status: DC | PRN

## 2017-09-12 NOTE — Discharge Instructions (Signed)

## 2017-09-17 ENCOUNTER — Ambulatory Visit (INDEPENDENT_AMBULATORY_CARE_PROVIDER_SITE_OTHER): Payer: 59 | Admitting: Advanced Practice Midwife

## 2017-09-17 DIAGNOSIS — Z23 Encounter for immunization: Secondary | ICD-10-CM | POA: Diagnosis not present

## 2017-09-17 DIAGNOSIS — Z3400 Encounter for supervision of normal first pregnancy, unspecified trimester: Secondary | ICD-10-CM

## 2017-09-17 DIAGNOSIS — Z3403 Encounter for supervision of normal first pregnancy, third trimester: Secondary | ICD-10-CM

## 2017-09-17 NOTE — Addendum Note (Signed)
Addended by: Anell Barr on: 09/17/2017 12:29 PM   Modules accepted: Orders

## 2017-09-17 NOTE — Progress Notes (Signed)
   PRENATAL VISIT NOTE  Subjective:  Hailey Mckenzie is a 32 y.o. G1P0 at [redacted]w[redacted]d being seen today for ongoing prenatal care.  She is currently monitored for the following issues for this low-risk pregnancy and has Supervision of normal first pregnancy, antepartum; Bipolar disorder (HCC); and Joint pain on their problem list.  Patient reports no complaints. Concerned about adjusting to life w/ baby. Worried that she won't be "herself" any more. Likes to plan things.  Contractions: Not present. Vag. Bleeding: None.  Movement: Present. Denies leaking of fluid.   The following portions of the patient's history were reviewed and updated as appropriate: allergies, current medications, past family history, past medical history, past social history, past surgical history and problem list. Problem list updated.  Objective:   Vitals:   09/17/17 1116  BP: 117/78  Pulse: 63  Weight: 151 lb (68.5 kg)    Fetal Status: Fetal Heart Rate (bpm): 147   Movement: Present     General:  Alert, oriented and cooperative. Patient is in no acute distress.  Skin: Skin is warm and dry. No rash noted.   Cardiovascular: Normal heart rate noted  Respiratory: Normal respiratory effort, no problems with respiration noted  Abdomen: Soft, gravid, appropriate for gestational age.  Pain/Pressure: Present     Pelvic: Cervical exam deferred        Extremities: Normal range of motion.  Edema: None  Mental Status: Normal mood and affect. Normal behavior. Normal judgment and thought content.   Assessment and Plan:  Pregnancy: G1P0 at [redacted]w[redacted]d  1. Supervision of normal first pregnancy, antepartum - Peds list and circ info given - Discussed balancing planning w/ flexibility and some realistic expectations for birth.   Preterm labor symptoms and general obstetric precautions including but not limited to vaginal bleeding, contractions, leaking of fluid and fetal movement were reviewed in detail with the patient. Please refer  to After Visit Summary for other counseling recommendations.  No follow-ups on file.  Future Appointments  Date Time Provider Department Center  10/01/2017 10:45 AM Dorathy Kinsman, CNM [facility] None    Dorathy Kinsman, [place]

## 2017-09-17 NOTE — Patient Instructions (Addendum)
TDaP Vaccine Pregnancy Get the Whooping Cough Vaccine While You Are Pregnant (CDC)  It is important for women to get the whooping cough vaccine in the third trimester of each pregnancy. Vaccines are the best way to prevent this disease. There are 2 different whooping cough vaccines. Both vaccines combine protection against whooping cough, tetanus and diphtheria, but they are for different age groups: Tdap: for everyone 11 years or older, including pregnant women  DTaP: for children 2 months through 6 years of age  You need the whooping cough vaccine during each of your pregnancies The recommended time to get the shot is during your 27th through 36th week of pregnancy, preferably during the earlier part of this time period. The Centers for Disease Control and Prevention (CDC) recommends that pregnant women receive the whooping cough vaccine for adolescents and adults (called Tdap vaccine) during the third trimester of each pregnancy. The recommended time to get the shot is during your 27th through 36th week of pregnancy, preferably during the earlier part of this time period. This replaces the original recommendation that pregnant women get the vaccine only if they had not previously received it. The American College of Obstetricians and Gynecologists and the American College of Nurse-Midwives support this recommendation.  You should get the whooping cough vaccine while pregnant to pass protection to your baby frame support disabled and/or not supported in this browser  Learn why Hailey Mckenzie decided to get the whooping cough vaccine in her 3rd trimester of pregnancy and how her baby girl was born with some protection against the disease. Also available on YouTube. After receiving the whooping cough vaccine, your body will create protective antibodies (proteins produced by the body to fight off diseases) and pass some of them to your baby before birth. These antibodies provide your baby some short-term  protection against whooping cough in early life. These antibodies can also protect your baby from some of the more serious complications that come along with whooping cough. Your protective antibodies are at their highest about 2 weeks after getting the vaccine, but it takes time to pass them to your baby. So the preferred time to get the whooping cough vaccine is early in your third trimester. The amount of whooping cough antibodies in your body decreases over time. That is why CDC recommends you get a whooping cough vaccine during each pregnancy. Doing so allows each of your babies to get the greatest number of protective antibodies from you. This means each of your babies will get the best protection possible against this disease.  Getting the whooping cough vaccine while pregnant is better than getting the vaccine after you give birth Whooping cough vaccination during pregnancy is ideal so your baby will have short-term protection as soon as he is born. This early protection is important because your baby will not start getting his whooping cough vaccines until he is 2 months old. These first few months of life are when your baby is at greatest risk for catching whooping cough. This is also when he's at greatest risk for having severe, potentially life-threating complications from the infection. To avoid that gap in protection, it is best to get a whooping cough vaccine during pregnancy. You will then pass protection to your baby before he is born. To continue protecting your baby, he should get whooping cough vaccines starting at 2 months old. You may never have gotten the Tdap vaccine before and did not get it during this pregnancy. If so, you should make sure   to get the vaccine immediately after you give birth, before leaving the hospital or birthing center. It will take about 2 weeks before your body develops protection (antibodies) in response to the vaccine. Once you have protection from the vaccine,  you are less likely to give whooping cough to your newborn while caring for him. But remember, your baby will still be at risk for catching whooping cough from others. A recent study looked to see how effective Tdap was at preventing whooping cough in babies whose mothers got the vaccine while pregnant or in the hospital after giving birth. The study found that getting Tdap between 27 through 36 weeks of pregnancy is 85% more effective at preventing whooping cough in babies younger than 2 months old. Blood tests cannot tell if you need a whooping cough vaccine There are no blood tests that can tell you if you have enough antibodies in your body to protect yourself or your baby against whooping cough. Even if you have been sick with whooping cough in the past or previously received the vaccine, you still should get the vaccine during each pregnancy. Breastfeeding may pass some protective antibodies onto your baby By breastfeeding, you may pass some antibodies you have made in response to the vaccine to your baby. When you get a whooping cough vaccine during your pregnancy, you will have antibodies in your breast milk that you can share with your baby as soon as your milk comes in. However, your baby will not get protective antibodies immediately if you wait to get the whooping cough vaccine until after delivering your baby. This is because it takes about 2 weeks for your body to create antibodies. Learn more about the health benefits of breastfeeding.  AREA PEDIATRIC/FAMILY PRACTICE PHYSICIANS  [facility] [address] [place], [place]  [address] Phone - [phone]   Fax - [phone]  [facility] [address] [place], [place] [address] Phone - [phone]   Fax - [phone]  JACK AMOS [address] [place], [place]  [address] Phone - [phone]   Fax - [phone]  [facility] [address] [place]  [address] Phone - [phone]   Fax - [phone]  [facility] [address] [place], [place]  [address] Phone - [phone]   Fax - [phone]  [facility] [address], [address] [place], [place]  [address] Phone - [phone]   Fax - [phone]  [facility] [address] [place], [place]  [address] Phone - [phone]   Fax - [phone]  [facility] [address] [place], [place]  [address] Phone - [phone]   Fax - [phone]  [facility] [address] [place], [place]  [address] Phone - [phone]   Fax - [phone] [facility] [address] [place], [place]  [address] Phone - [phone]   Fax - [phone]  [facility] [address] [place], [place]  [address] Phone - [phone]   Fax - [phone]  [facility] [address], [address] [place], [place]  [address] Phone - [phone]   Fax - [phone]  [facility] [address] [place], [place]  [address] Phone - [phone]   Fax - [phone]  [facility] [address], [address]  [place], [place]  [address] Phone - [phone]  [facility] [address] [place], [place]  [address] Phone - [phone]   Fax - [phone]  [facility] [address], [address] [place], [place]  [address] Phone - [phone]   Fax - [phone]  [facility] [address] [place], [place]  [address] Phone - [phone]   Fax - [phone]  [facility] [address] [place], [place]  [address] Phone - [phone]   Fax - [phone]   [facility] [address] [place], [place]  [address] Phone - [phone]   Fax - [phone]  [facility]  PEDIATR[facility] [address] [place], [place]  [address] Phone - [phone]   Fax - [phone]  DAVID RUBIN [address] [place], [place]  [address] Phone - [phone]   Fax - [phone]  [facility] [address] [place], [place]  [address] Phone - [phone]   Fax - [phone]  [facility] Al[facility] Chyle [address] [place], [place]  [address] Phone - [phone]   Fax - [phone] Gerarda Fraction [address] [place], [place]  [address] Phone - [phone]   Fax - [phone]  [facility] [address] [place], [place]  [address] Phone - [phone]   Fax - [phone]  [facility] [place] [address] [place], [place]  [address] Phone - [phone]   Fax - [phone]  [facility] Wyvonne Lenz MD [address] [place] [address] Phone [phone]  Fax [phone]  Places to have your son circumcised:    [facility] [phone] while you are in hospital  [facility] [phone] $244 by 4 wks  [facility] [phone] $175 by 2 wks  [facility] [phone] $250 by 7 days [facility] [phone] $269 by 4 wks  These prices sometimes change but are roughly what you can expect to pay. Please call and confirm pricing.   Circumcision is considered an elective/non-medically necessary procedure. There are many reasons parents decide to have their sons circumsized. During the first year of life circumcised males have a reduced risk of urinary tract infections but after this year the rates between circumcised males and uncircumcised males are the same.  It is safe to have your son circumcised outside of the hospital and the places above perform  them regularly.   Deciding about Circumcision in Baby Boys  (Up-to-date The Basics)  What is circumcision?  Circumcision is a surgery that removes the skin that covers the tip of the penis, called the "foreskin" Circumcision is usually done when a boy is between 37 and 69 days old. In the [place], circumcision is common. In some other countries, fewer boys are circumcised. Circumcision is a common tradition in some religions.  Should I have my baby boy circumcised?  There is no easy answer. Circumcision has some benefits. But it also has risks. After talking with your doctor, you will have to decide for yourself what is right for your family.  What are the benefits of circumcision?  Circumcised boys seem to have slightly lower rates of: ?Urinary tract infections ?Swelling of the opening at the tip of the penis Circumcised men seem to have slightly lower rates of: ?Urinary tract infections ?Swelling of the opening at the tip of the penis ?Penis cancer ?HIV and other infections that you catch during sex ?Cervical cancer in the  women they have sex with Even so, in the [place], the risks of these problems are small - even in boys and men who have not been circumcised. Plus, boys and men who are not circumcised can reduce these extra risks by: ?Cleaning their penis well ?Using condoms during sex  What are the risks of circumcision?  Risks include: ?Bleeding or infection from the surgery ?Damage to or amputation of the penis ?A chance that the doctor will cut off too much or not enough of the foreskin ?A chance that sex won't feel as good later in life Only about 1 out of every 200 circumcisions leads to problems. There is also a chance that your health insurance won't pay for circumcision.  How is circumcision done in baby boys?  First, the baby gets medicine for pain relief. This might be a cream on the skin or a shot into the base of the penis. Next, the doctor  cleans the baby's penis well. Then he or she uses special tools to cut off the foreskin. Finally, the doctor wraps a bandage (called gauze) around the baby's penis. If you have your baby circumcised, his doctor or nurse will give you instructions on how to care for him after the surgery. It is important that you follow those instructions carefully.

## 2017-10-01 ENCOUNTER — Other Ambulatory Visit: Payer: Self-pay | Admitting: Advanced Practice Midwife

## 2017-10-01 ENCOUNTER — Ambulatory Visit (INDEPENDENT_AMBULATORY_CARE_PROVIDER_SITE_OTHER): Payer: 59 | Admitting: Advanced Practice Midwife

## 2017-10-01 VITALS — BP 137/90 | HR 68 | Wt 154.4 lb

## 2017-10-01 DIAGNOSIS — O133 Gestational [pregnancy-induced] hypertension without significant proteinuria, third trimester: Secondary | ICD-10-CM | POA: Diagnosis not present

## 2017-10-01 DIAGNOSIS — Z113 Encounter for screening for infections with a predominantly sexual mode of transmission: Secondary | ICD-10-CM

## 2017-10-01 DIAGNOSIS — Z3400 Encounter for supervision of normal first pregnancy, unspecified trimester: Secondary | ICD-10-CM

## 2017-10-01 NOTE — Progress Notes (Signed)
   PRENATAL VISIT NOTE  Subjective:  Hailey Mckenzie is a 32 y.o. G1P0 at 83w6dbeing seen today for ongoing prenatal care.  She is currently monitored for the following issues for this low-risk pregnancy and has Supervision of normal first pregnancy, antepartum; Bipolar disorder ([place]; Joint pain; and Transient hypertension of pregnancy in third trimester on their problem list.  Patient reports no complaints.  Denies HA, vision changes or epigastric pain. Contractions: Not present. Vag. Bleeding: None.  Movement: (!) Decreased, but baby has never been very active. Denies leaking of fluid.   The following portions of the patient's history were reviewed and updated as appropriate: allergies, current medications, past family history, past medical history, past social history, past surgical history and problem list. Problem list updated.  Objective:   Vitals:   10/01/17 1105 10/01/17 1109  BP: (!) 135/92 137/90  Pulse: 80 68  Weight: 154 lb 6.4 oz (70 kg)     Fetal Status: Fetal Heart Rate (bpm): 145 Fundal Height: 36 cm Movement: (!) Decreased  Presentation: Vertex  EFM: Baseline: 140 bpm, Variability: Good {> 6 bpm), Accelerations: Reactive and Decelerations: Absent Toco: none  General:  Alert, oriented and cooperative. Patient is in no acute distress.  Skin: Skin is warm and dry. No rash noted.   Cardiovascular: Normal heart rate noted  Respiratory: Normal respiratory effort, no problems with respiration noted  Abdomen: Soft, gravid, appropriate for gestational age.  Pain/Pressure: Present     Pelvic: Cervical exam performed Dilation: 1.5 Effacement (%): 70 Station: -31.5/70/-3, soft  Extremities: Normal range of motion.  Edema: None. DTRs 2+  Mental Status: Normal mood and affect. Normal behavior. Normal judgment and thought content.   Assessment and Plan:  Pregnancy: G1P0 at 371w6d1. Supervision of normal first pregnancy, antepartum  - Culture, beta strep (group b only) -  GC/Chlamydia probe amp ([place] reviewed and signed.   2. Transient hypertension of pregnancy in third trimester - Pre-E precautions - CBC - Comp Met (CMET) - Protein / creatinine ratio, urine - Discussed that she would still be a candidate fr waterbirth w/ GHTN, but not for Pre-E or if she requires continuous fetal monitoring.   Preterm labor symptoms and general obstetric precautions including but not limited to vaginal bleeding, contractions, leaking of fluid and fetal movement were reviewed in detail with the patient. Please refer to After Visit Summary for other counseling recommendations.  Return in 1 week (on 10/08/2017) for ROB.  Future Appointments  Date Time Provider [place]5/24/2019 10:00 AM [facility] NURSE [facility] None  10/09/2017  3:00 PM HaLavonia DraftsMD [facility] None  10/15/2017 10:30 AM WiSeabron SpatesCNM [facility] None  10/23/2017  4:15 PM Anyanwu, UgSallyanne HaversMD [facility] None  10/29/2017 10:30 AM WiSeabron SpatesCNM [facility] None    ViManya SilvasCNM

## 2017-10-01 NOTE — Progress Notes (Incomplete)
   PRENATAL VISIT NOTE  Subjective:  Hailey Mckenzie is a 32 y.o. G1P0 at 28w6dbeing seen today for ongoing prenatal care.  She is currently monitored for the following issues for this {Blank single:19197::"high-risk","low-risk"} pregnancy and has Supervision of normal first pregnancy, antepartum; Bipolar disorder ([place]; Joint pain; and Transient hypertension of pregnancy in third trimester on their problem list.  Patient reports {sx:14538}.  Contractions: Not present. Vag. Bleeding: None.  Movement: (!) Decreased. Denies leaking of fluid.   The following portions of the patient's history were reviewed and updated as appropriate: allergies, current medications, past family history, past medical history, past social history, past surgical history and problem list. Problem list updated.  Objective:   Vitals:   10/01/17 1105 10/01/17 1109  BP: (!) 135/92 137/90  Pulse: 80 68  Weight: 154 lb 6.4 oz (70 kg)     Fetal Status: Fetal Heart Rate (bpm): 145   Movement: (!) Decreased     General:  Alert, oriented and cooperative. Patient is in no acute distress.  Skin: Skin is warm and dry. No rash noted.   Cardiovascular: Normal heart rate noted  Respiratory: Normal respiratory effort, no problems with respiration noted  Abdomen: Soft, gravid, appropriate for gestational age.  Pain/Pressure: Present     Pelvic: {Blank single:19197::"Cervical exam performed","Cervical exam deferred"}        Extremities: Normal range of motion.  Edema: None  Mental Status: Normal mood and affect. Normal behavior. Normal judgment and thought content.   Assessment and Plan:  Pregnancy: G1P0 at 382w6d1. Supervision of normal first pregnancy, antepartum *** - Culture, beta strep (group b only) - GC/Chlamydia probe amp ([place]. Transient hypertension of pregnancy in third trimester *** - CBC - Comp Met (CMET) - Protein / creatinine ratio, urine  {Blank single:19197::"Term","Preterm"}  labor symptoms and general obstetric precautions including but not limited to vaginal bleeding, contractions, leaking of fluid and fetal movement were reviewed in detail with the patient. Please refer to After Visit Summary for other counseling recommendations.  Return in 1 week (on 10/08/2017).  No future appointments.  ViManya SilvasCNM

## 2017-10-01 NOTE — Patient Instructions (Addendum)
Hypertension During Pregnancy Hypertension, commonly called high blood pressure, is when the force of blood pumping through your arteries is too strong. Arteries are blood vessels that carry blood from the heart throughout the body. Hypertension during pregnancy can cause problems for you and your baby. Your baby may be born early (prematurely) or may not weigh as much as he or she should at birth. Very bad cases of hypertension during pregnancy can be life-threatening. Different types of hypertension can occur during pregnancy. These include:  Chronic hypertension. This happens when: ? You have hypertension before pregnancy and it continues during pregnancy. ? You develop hypertension before you are [redacted] weeks pregnant, and it continues during pregnancy.  Gestational hypertension. This is hypertension that develops after the 20th week of pregnancy.  Preeclampsia, also called toxemia of pregnancy. This is a very serious type of hypertension that develops only during pregnancy. It affects the whole body, and it can be very dangerous for you and your baby.  Gestational hypertension and preeclampsia usually go away within 6 weeks after your baby is born. Women who have hypertension during pregnancy have a greater chance of developing hypertension later in life or during future pregnancies. What are the causes? The exact cause of hypertension is not known. What increases the risk? There are certain factors that make it more likely for you to develop hypertension during pregnancy. These include:  Having hypertension during a previous pregnancy or prior to pregnancy.  Being overweight.  Being older than age 107.  Being pregnant for the first time or being pregnant with more than one baby.  Becoming pregnant using fertilization methods such as IVF (in vitro fertilization).  Having diabetes, kidney problems, or systemic lupus erythematosus.  Having a family history of hypertension.  What are the  signs or symptoms? Chronic hypertension and gestational hypertension rarely cause symptoms. Preeclampsia causes symptoms, which may include:  Increased protein in your urine. Your health care provider will check for this at every visit before you give birth (prenatal visit).  Severe headaches.  Sudden weight gain.  Swelling of the hands, face, legs, and feet.  Nausea and vomiting.  Vision problems, such as blurred or double vision.  Numbness in the face, arms, legs, and feet.  Dizziness.  Slurred speech.  Sensitivity to bright lights.  Abdominal pain.  Convulsions.  How is this diagnosed? You may be diagnosed with hypertension during a routine prenatal exam. At each prenatal visit, you may:  Have a urine test to check for high amounts of protein in your urine.  Have your blood pressure checked. A blood pressure reading is recorded as two numbers, such as "120 over 80" (or 120/80). The first ("top") number is called the systolic pressure. It is a measure of the pressure in your arteries when your heart beats. The second ("bottom") number is called the diastolic pressure. It is a measure of the pressure in your arteries as your heart relaxes between beats. Blood pressure is measured in a unit called mm Hg. A normal blood pressure reading is: ? Systolic: below 235. ? Diastolic: below 80.  The type of hypertension that you are diagnosed with depends on your test results and when your symptoms developed.  Chronic hypertension is usually diagnosed before 20 weeks of pregnancy.  Gestational hypertension is usually diagnosed after 20 weeks of pregnancy.  Hypertension with high amounts of protein in the urine is diagnosed as preeclampsia.  Blood pressure measurements that stay above 573 systolic, or above 220 diastolic, are  signs of severe preeclampsia.  How is this treated? Treatment for hypertension during pregnancy varies depending on the type of hypertension you have and how  serious it is.  If you take medicines called ACE inhibitors to treat chronic hypertension, you may need to switch medicines. ACE inhibitors should not be taken during pregnancy.  If you have gestational hypertension, you may need to take blood pressure medicine.  If you are at risk for preeclampsia, your health care provider may recommend that you take a low-dose aspirin every day to prevent high blood pressure during your pregnancy.  If you have severe preeclampsia, you may need to be hospitalized so you and your baby can be monitored closely. You may also need to take medicine (magnesium sulfate) to prevent seizures and to lower blood pressure. This medicine may be given as an injection or through an IV tube.  In some cases, if your condition gets worse, you may need to deliver your baby early.  Follow these instructions at home: Eating and drinking  Drink enough fluid to keep your urine clear or pale yellow.  Eat a healthy diet that is low in salt (sodium). Do not add salt to your food. Check food labels to see how much sodium a food or beverage contains. Lifestyle  Do not use any products that contain nicotine or tobacco, such as cigarettes and e-cigarettes. If you need help quitting, ask your health care provider.  Do not use alcohol.  Avoid caffeine.  Avoid stress as much as possible. Rest and get plenty of sleep. General instructions  Take over-the-counter and prescription medicines only as told by your health care provider.  While lying down, lie on your left side. This keeps pressure off your baby.  While sitting or lying down, raise (elevate) your feet. Try putting some pillows under your lower legs.  Exercise regularly. Ask your health care provider what kinds of exercise are best for you.  Keep all prenatal and follow-up visits as told by your health care provider. This is important. Contact a health care provider if:  You have symptoms that your health care  provider told you may require more treatment or monitoring, such as: ? Fever. ? Vomiting. ? Headache. Get help right away if:  You have severe abdominal pain or vomiting that does not get better with treatment.  You suddenly develop swelling in your hands, ankles, or face.  You gain 4 lbs (1.8 kg) or more in 1 week.  You develop vaginal bleeding, or you have blood in your urine.  You do not feel your baby moving as much as usual.  You have blurred or double vision.  You have muscle twitching or sudden tightening (spasms).  You have shortness of breath.  Your lips or fingernails turn blue. This information is not intended to replace advice given to you by your health care provider. Make sure you discuss any questions you have with your health care provider. Document Released: 01/16/2011 Document Revised: 11/18/2015 Document Reviewed: 10/14/2015 Elsevier Interactive Patient Education  2018 [place]? Guide for patients at [facility] [employer] [facility]  Why consider waterbirth?  . Gentle birth for babies . Less pain medicine used in labor . May allow for passive descent/less pushing . May reduce perineal tears  . More mobility and instinctive maternal position changes . Increased maternal relaxation . Reduced blood pressure in labor  Is waterbirth safe? What are the risks of infection, drowning or other complications?  . Infection:  o Very low risk (3.7 % for tub vs 4.8% for bed) o 7 in 8000 waterbirths with documented infection o Poorly cleaned equipment most common cause o Slightly lower group B strep transmission rate  . Drowning o Maternal:  - Very low risk   - Related to seizures or fainting o Newborn:  - Very low risk. No evidence of increased risk of respiratory problems in multiple large studies - Physiological protection from breathing under water - Avoid underwater birth if there are any fetal complications - Once baby's  head is out of the water, keep it out.  . Birth complication o Some reports of cord trauma, but risk decreased by bringing baby to surface gradually o No evidence of increased risk of shoulder dystocia. Mothers can usually change positions faster in water than in a bed, possibly aiding the maneuvers to free the shoulder.   You must attend a Doren Custard class at [facility]  3rd Wednesday of every month from 7-9pm  [employer] by calling [phone] or online at [email or web address]  Bring [place] the certificate from the class to your prenatal appointment  Meet with a midwife at 36 weeks to see if you can still plan a waterbirth and to sign the consent.   Purchase or rent the following supplies:   Water Birth Pool (Birth Pool in a Box or [employer] for instance)  (Tubs start ~$125)  Single-use disposable tub liner designed for your brand of tub  New garden hose labeled "lead-free", "suitable for drinking water",  Electric drain pump to remove water (We recommend 792 gallon per hour or greater pump.)   Separate garden hose to remove the dirty water  Fish net  Bathing suit top (optional)  Long-handled mirror (optional)  Places to purchase or rent supplies  [employer] for tub purchases and supplies  Waterbirthsolutions.com for tub purchases and supplies  [employer] (www.thelaborladies.com) $275 for tub rental/set-up & take down/kit   [employer] (http://www.fleming.com/.htm) Information regarding doulas (labor support) who provide pool rentals  Our practice has a Birth Pool in a Box tub at the hospital that you may borrow on a first-come-first-served basis. It is your responsibility to to set up, clean and break down the tub. We cannot guarantee the availability of this tub in advance. You are responsible for bringing all accessories listed above. If you do not have all necessary supplies you cannot have a waterbirth.    Things  that would prevent you from having a waterbirth:  Premature, <37wks  Previous cesarean birth  Presence of thick meconium-stained fluid  Multiple gestation (Twins, triplets, etc.)  Uncontrolled diabetes or gestational diabetes requiring medication  Hypertension requiring medication or diagnosis of pre-eclampsia  Heavy vaginal bleeding  Non-reassuring fetal heart rate  Active infection (MRSA, etc.). Group B Strep is NOT a contraindication for  waterbirth.  If your labor has to be induced and induction method requires continuous  monitoring of the baby's heart rate  Other risks/issues identified by your obstetrical provider  Please remember that birth is unpredictable. Under certain unforeseeable circumstances your provider may advise against giving birth in the tub. These decisions will be made on a case-by-case basis and with the safety of you and your baby as our highest priority.

## 2017-10-02 LAB — PROTEIN / CREATININE RATIO, URINE
CREATININE, UR: 159.1 mg/dL
PROTEIN UR: 73.9 mg/dL
Protein/Creat Ratio: 464 mg/g creat — ABNORMAL HIGH (ref 0–200)

## 2017-10-02 LAB — COMPREHENSIVE METABOLIC PANEL

## 2017-10-02 LAB — CBC
HEMATOCRIT: 38.5 % (ref 34.0–46.6)
HEMOGLOBIN: 12.9 g/dL (ref 11.1–15.9)
MCH: 23.1 pg — AB (ref 26.6–33.0)
MCHC: 33.5 g/dL (ref 31.5–35.7)
MCV: 69 fL — AB (ref 79–97)
PLATELETS: 228 10*3/uL (ref 150–450)
RBC: 5.58 x10E6/uL — AB (ref 3.77–5.28)
RDW: 16.7 % — ABNORMAL HIGH (ref 12.3–15.4)
WBC: 8.3 10*3/uL (ref 3.4–10.8)

## 2017-10-02 LAB — GC/CHLAMYDIA PROBE AMP (~~LOC~~) NOT AT ARMC
Chlamydia: NEGATIVE
Neisseria Gonorrhea: NEGATIVE

## 2017-10-02 NOTE — Addendum Note (Signed)
Addended by: Anell Barr on: 10/02/2017 01:21 PM   Modules accepted: Orders

## 2017-10-03 LAB — COMPREHENSIVE METABOLIC PANEL
A/G RATIO: 1.2 (ref 1.2–2.2)
ALK PHOS: 143 IU/L — AB (ref 39–117)
ALT: 8 IU/L (ref 0–32)
AST: 20 IU/L (ref 0–40)
Albumin: 3.7 g/dL (ref 3.5–5.5)
BILIRUBIN TOTAL: 0.5 mg/dL (ref 0.0–1.2)
BUN/Creatinine Ratio: 9 (ref 9–23)
BUN: 7 mg/dL (ref 6–20)
CHLORIDE: 105 mmol/L (ref 96–106)
CO2: 19 mmol/L — ABNORMAL LOW (ref 20–29)
Calcium: 9.6 mg/dL (ref 8.7–10.2)
Creatinine, Ser: 0.8 mg/dL (ref 0.57–1.00)
GFR calc non Af Amer: 99 mL/min/{1.73_m2} (ref >59)
GFR, EST AFRICAN AMERICAN: 114 mL/min/{1.73_m2} (ref >59)
GLUCOSE: 69 mg/dL (ref 65–99)
Globulin, Total: 3.2 g/dL (ref 1.5–4.5)
POTASSIUM: 4.5 mmol/L (ref 3.5–5.2)
Sodium: 139 mmol/L (ref 134–144)
TOTAL PROTEIN: 6.9 g/dL (ref 6.0–8.5)

## 2017-10-04 ENCOUNTER — Encounter: Payer: Self-pay | Admitting: Advanced Practice Midwife

## 2017-10-04 ENCOUNTER — Inpatient Hospital Stay (HOSPITAL_COMMUNITY)
Admission: AD | Admit: 2017-10-04 | Discharge: 2017-10-04 | Disposition: A | Discharge status: Home / Self Care | Payer: 59 | Source: Ambulatory Visit | Attending: Obstetrics and Gynecology | Admitting: Obstetrics and Gynecology

## 2017-10-04 ENCOUNTER — Encounter (HOSPITAL_COMMUNITY): Payer: Self-pay | Admitting: *Deleted

## 2017-10-04 ENCOUNTER — Ambulatory Visit (INDEPENDENT_AMBULATORY_CARE_PROVIDER_SITE_OTHER): Payer: 59 | Admitting: Advanced Practice Midwife

## 2017-10-04 DIAGNOSIS — O1493 Unspecified pre-eclampsia, third trimester: Secondary | ICD-10-CM

## 2017-10-04 DIAGNOSIS — O368130 Decreased fetal movements, third trimester, not applicable or unspecified: Secondary | ICD-10-CM | POA: Diagnosis not present

## 2017-10-04 DIAGNOSIS — O139 Gestational [pregnancy-induced] hypertension without significant proteinuria, unspecified trimester: Secondary | ICD-10-CM | POA: Insufficient documentation

## 2017-10-04 DIAGNOSIS — O133 Gestational [pregnancy-induced] hypertension without significant proteinuria, third trimester: Secondary | ICD-10-CM

## 2017-10-04 HISTORY — DX: Gestational (pregnancy-induced) hypertension without significant proteinuria, unspecified trimester: O13.9

## 2017-10-04 LAB — PROTEIN / CREATININE RATIO, URINE
Creatinine, Urine: 92 mg/dL
Protein Creatinine Ratio: 0.6 mg/mg{Cre} — ABNORMAL HIGH (ref 0.00–0.15)
Total Protein, Urine: 55 mg/dL

## 2017-10-04 LAB — COMPREHENSIVE METABOLIC PANEL
ALBUMIN: 2.9 g/dL — AB (ref 3.5–5.0)
ALK PHOS: 130 U/L — AB (ref 38–126)
ALT: 10 U/L — ABNORMAL LOW (ref 14–54)
ANION GAP: 10 (ref 5–15)
AST: 18 U/L (ref 15–41)
BILIRUBIN TOTAL: 0.3 mg/dL (ref 0.3–1.2)
BUN: 14 mg/dL (ref 6–20)
CALCIUM: 8.6 mg/dL — AB (ref 8.9–10.3)
CO2: 20 mmol/L — AB (ref 22–32)
Chloride: 107 mmol/L (ref 101–111)
Creatinine, Ser: 0.89 mg/dL (ref 0.44–1.00)
GFR calc Af Amer: >60 mL/min (ref >60)
GFR calc non Af Amer: >60 mL/min (ref >60)
GLUCOSE: 103 mg/dL — AB (ref 65–99)
Potassium: 4.1 mmol/L (ref 3.5–5.1)
SODIUM: 137 mmol/L (ref 135–145)
TOTAL PROTEIN: 6.2 g/dL — AB (ref 6.5–8.1)

## 2017-10-04 LAB — CBC
HEMATOCRIT: 34.1 % — AB (ref 36.0–46.0)
HEMOGLOBIN: 11.3 g/dL — AB (ref 12.0–15.0)
MCH: 23.5 pg — AB (ref 26.0–34.0)
MCHC: 33.1 g/dL (ref 30.0–36.0)
MCV: 71 fL — AB (ref 78.0–100.0)
Platelets: 190 10*3/uL (ref 150–400)
RBC: 4.8 MIL/uL (ref 3.87–5.11)
RDW: 15.7 % — ABNORMAL HIGH (ref 11.5–15.5)
WBC: 9.4 10*3/uL (ref 4.0–10.5)

## 2017-10-04 LAB — URINALYSIS, ROUTINE W REFLEX MICROSCOPIC
Bilirubin Urine: NEGATIVE
GLUCOSE, UA: NEGATIVE mg/dL
Hgb urine dipstick: NEGATIVE
Ketones, ur: NEGATIVE mg/dL
Nitrite: NEGATIVE
PH: 6 (ref 5.0–8.0)
Protein, ur: 30 mg/dL — AB
SPECIFIC GRAVITY, URINE: 1.011 (ref 1.005–1.030)

## 2017-10-04 LAB — OB RESULTS CONSOLE GBS: STREP GROUP B AG: NEGATIVE

## 2017-10-04 LAB — GROUP B STREP BY PCR: Group B strep by PCR: NEGATIVE

## 2017-10-04 MED ORDER — BETAMETHASONE SOD PHOS & ACET 6 (3-3) MG/ML IJ SUSP
12.0000 mg | INTRAMUSCULAR | Status: DC
  Administered 2017-10-04: 12 mg via INTRAMUSCULAR
  Filled 2017-10-04: qty 2

## 2017-10-04 NOTE — Discharge Instructions (Signed)
Preeclampsia and Eclampsia °Preeclampsia is a serious condition that develops only during pregnancy. It is also called toxemia of pregnancy. This condition causes high blood pressure along with other symptoms, such as swelling and headaches. These symptoms may develop as the condition gets worse. Preeclampsia may occur at 20 weeks of pregnancy or later. °Diagnosing and treating preeclampsia early is very important. If not treated early, it can cause serious problems for you and your baby. One problem it can lead to is eclampsia, which is a condition that causes muscle jerking or shaking (convulsions or seizures) in the mother. Delivering your baby is the best treatment for preeclampsia or eclampsia. Preeclampsia and eclampsia symptoms usually go away after your baby is born. °What are the causes? °The cause of preeclampsia is not known. °What increases the risk? °The following risk factors make you more likely to develop preeclampsia: °· Being pregnant for the first time. °· Having had preeclampsia during a past pregnancy. °· Having a family history of preeclampsia. °· Having high blood pressure. °· Being pregnant with twins or triplets. °· Being 35 or older. °· Being African-American. °· Having kidney disease or diabetes. °· Having medical conditions such as lupus or blood diseases. °· Being very overweight (obese). ° °What are the signs or symptoms? °The earliest signs of preeclampsia are: °· High blood pressure. °· Increased protein in your urine. Your health care provider will check for this at every visit before you give birth (prenatal visit). ° °Other symptoms that may develop as the condition gets worse include: °· Severe headaches. °· Sudden weight gain. °· Swelling of the hands, face, legs, and feet. °· Nausea and vomiting. °· Vision problems, such as blurred or double vision. °· Numbness in the face, arms, legs, and feet. °· Urinating less than usual. °· Dizziness. °· Slurred speech. °· Abdominal pain,  especially upper abdominal pain. °· Convulsions or seizures. ° °Symptoms generally go away after giving birth. °How is this diagnosed? °There are no screening tests for preeclampsia. Your health care provider will ask you about symptoms and check for signs of preeclampsia during your prenatal visits. You may also have tests that include: °· Urine tests. °· Blood tests. °· Checking your blood pressure. °· Monitoring your baby’s heart rate. °· Ultrasound. ° °How is this treated? °You and your health care provider will determine the treatment approach that is best for you. Treatment may include: °· Having more frequent prenatal exams to check for signs of preeclampsia, if you have an increased risk for preeclampsia. °· Bed rest. °· Reducing how much salt (sodium) you eat. °· Medicine to lower your blood pressure. °· Staying in the hospital, if your condition is severe. There, treatment will focus on controlling your blood pressure and the amount of fluids in your body (fluid retention). °· You may need to take medicine (magnesium sulfate) to prevent seizures. This medicine may be given as an injection or through an IV tube. °· Delivering your baby early, if your condition gets worse. You may have your labor started with medicine (induced), or you may have a cesarean delivery. ° °Follow these instructions at home: °Eating and drinking ° °· Drink enough fluid to keep your urine clear or pale yellow. °· Eat a healthy diet that is low in sodium. Do not add salt to your food. Check nutrition labels to see how much sodium a food or beverage contains. °· Avoid caffeine. °Lifestyle °· Do not use any products that contain nicotine or tobacco, such as cigarettes   and e-cigarettes. If you need help quitting, ask your health care provider. °· Do not use alcohol or drugs. °· Avoid stress as much as possible. Rest and get plenty of sleep. °General instructions °· Take over-the-counter and prescription medicines only as told by your  health care provider. °· When lying down, lie on your side. This keeps pressure off of your baby. °· When sitting or lying down, raise (elevate) your feet. Try putting some pillows underneath your lower legs. °· Exercise regularly. Ask your health care provider what kinds of exercise are best for you. °· Keep all follow-up and prenatal visits as told by your health care provider. This is important. °How is this prevented? °To prevent preeclampsia or eclampsia from developing during another pregnancy: °· Get proper medical care during pregnancy. Your health care provider may be able to prevent preeclampsia or diagnose and treat it early. °· Your health care provider may have you take a low-dose aspirin or a calcium supplement during your next pregnancy. °· You may have tests of your blood pressure and kidney function after giving birth. °· Maintain a healthy weight. Ask your health care provider for help managing weight gain during pregnancy. °· Work with your health care provider to manage any long-term (chronic) health conditions you have, such as diabetes or kidney problems. ° °Contact a health care provider if: °· You gain more weight than expected. °· You have headaches. °· You have nausea or vomiting. °· You have abdominal pain. °· You feel dizzy or light-headed. °Get help right away if: °· You develop sudden or severe swelling anywhere in your body. This usually happens in the legs. °· You gain 5 lbs (2.3 kg) or more during one week. °· You have severe: °? Abdominal pain. °? Headaches. °? Dizziness. °? Vision problems. °? Confusion. °? Nausea or vomiting. °· You have a seizure. °· You have trouble moving any part of your body. °· You develop numbness in any part of your body. °· You have trouble speaking. °· You have any abnormal bleeding. °· You pass out. °This information is not intended to replace advice given to you by your health care provider. Make sure you discuss any questions you have with your health  care provider. °Document Released: 04/27/2000 Document Revised: 12/27/2015 Document Reviewed: 12/05/2015 °Elsevier Interactive Patient Education © 2018 Elsevier Inc. ° °

## 2017-10-04 NOTE — Progress Notes (Signed)
Patient returned for blood pressure check. Patient denies any headaches, dizziness or blurry vision. Armandina Stammer RN

## 2017-10-04 NOTE — Patient Instructions (Signed)

## 2017-10-04 NOTE — MAU Provider Note (Addendum)
History     CSN: [id number]  Arrival date and time: 10/04/17 1412   First Provider Initiated Contact with Patient 10/04/17 1505      Chief Complaint  Patient presents with  . Hypertension    sent from office for BP trending high   HPI  Hailey Mckenzie is a  32 y.o. G1P0 at [redacted]w[redacted]d who present to MAU after having elevated BP in clinic this morning. Pt denies RUQ pain, headache, disturbances in her field of vision, contractions, leaking of fluid, vaginal bleeding and decrease fetal movement.   Patient's husband concerned that baby shower is scheduled for tomorrow and BMZ 2 of 2 interferes with scheduled activities.  OB History    Gravida  1   Para      Term      Preterm      AB      Living        SAB      TAB      Ectopic      Multiple      Live Births              Past Medical History:  Diagnosis Date  . Asthma   . Pregnancy induced hypertension   . Thalassemia trait     Past Surgical History:  Procedure Laterality Date  . WISDOM TOOTH EXTRACTION      Family History  Problem Relation Age of Onset  . Hypertension Mother   . Asthma Father   . Hypertension Maternal Grandmother   . Cancer Neg Hx   . Diabetes Neg Hx   . Stroke Neg Hx     Social History   Tobacco Use  . Smoking status: Never Smoker  . Smokeless tobacco: Never Used  Substance Use Topics  . Alcohol use: Yes    Alcohol/week: 0.6 oz    Types: 1 Glasses of wine per week  . Drug use: No    Allergies: No Known Allergies  Medications Prior to Admission  Medication Sig Dispense Refill Last Dose  . OVER THE COUNTER MEDICATION Take 1 tablet by mouth daily.   Past Week at Unknown time  . prenatal vitamin w/FE, FA (PRENATAL 1 + 1) 27-1 MG TABS tablet Take 1 tablet by mouth daily at 12 noon.   10/04/2017 at Unknown time  . Vitamin D, Ergocalciferol, (DRISDOL) 50000 units CAPS capsule Take 1 capsule (50,000 Units total) by mouth every 7 (seven) days. 30 capsule 0 Past Week at Unknown  time  . butalbital-acetaminophen-caffeine (FIORICET, ESGIC) 50-325-40 MG tablet Take 1-2 tablets by mouth every 6 (six) hours as needed for headache. (Patient not taking: Reported on 10/01/2017) 20 tablet 0 Not Taking    Review of Systems  Constitutional: Negative for activity change and appetite change.  HENT: Negative for facial swelling, sinus pressure, sinus pain and tinnitus.   Respiratory: Negative for shortness of breath.   Cardiovascular: Negative for chest pain, palpitations and leg swelling.  Gastrointestinal: Negative for abdominal pain, diarrhea, nausea and vomiting.  Endocrine: Positive for polyuria. Negative for polydipsia and polyphagia.  Genitourinary: Negative for difficulty urinating, vaginal bleeding, vaginal discharge and vaginal pain.  Allergic/Immunologic: Negative for environmental allergies.   Physical Exam   Blood pressure 137/70, pulse (!) 54, temperature 98.8 F (37.1 C), temperature source Oral, resp. rate 16, height  (1.676 m), weight 155 lb (70.3 kg), last menstrual period 01/23/2017, SpO2 100 %.  Physical Exam  Constitutional: She is oriented to person, place, and time.  She appears well-developed and well-nourished.  HENT:  Head: Normocephalic and atraumatic.  Neck: Normal range of motion. Neck supple.  Cardiovascular: Normal rate, regular rhythm, normal heart sounds and intact distal pulses.  Respiratory: Effort normal and breath sounds normal.  GI: There is no tenderness. There is no rebound and no guarding.  Gravid  Genitourinary: Vagina normal and uterus normal.  Musculoskeletal: Normal range of motion.  Neurological: She is alert and oriented to person, place, and time. She has normal reflexes.  Skin: Skin is warm and dry.  Psychiatric: She has a normal mood and affect. Her behavior is normal. Thought content normal.   SVE 1.5/thick/posterior  Fetal surveillance: Baseline 135, moderate variability, positive accelerations, no  decelerations Uterine: No contractions noted, one felt by patient  MAU Course  Procedures None  MDM Orders Placed This Encounter  Procedures  . Group B strep by PCR  . Urinalysis, Routine w reflex microscopic  . CBC  . Protein / creatinine ratio, urine  . Comprehensive metabolic panel  . Discharge patient Discharge disposition: 01-Home or Self Care; Discharge patient date: 10/04/2017    Assessment and Plan  - 32 y.o. G1P0 at [redacted]w[redacted]d - Preeclampsia without severe features  - Independently reviewed labs and discussed plan with Dr. Erin Fulling - Betamethasone 1 of 2 given now in MAU - Patient to return to MAU in 24 hours for Betamethasone 2 of 2 and blood pressure check - L&D will call patient prior to her IOL appointment on Wednesday 10/09/17 to confirm arrival time. Reviewed possible IOL methods  - Discussed that if BP is elevated tomorrow patient may be advised to proceed with IOL earlier than currently scheduled. Advised patient to pack hospital bag prior to returning to MAU tomorrow - Reviewed warning signs for worsening preeclampsia that would necessitate return to MAU sooner than 24 hours (see AVS) - Reviewed labor precautions  Calvert Cantor, CNM 10/04/2017, 5:45 PM

## 2017-10-04 NOTE — MAU Note (Signed)
Pt in office today for b/p check and b/p was elevated , sent to MAU for eval

## 2017-10-04 NOTE — Progress Notes (Signed)
   PRENATAL VISIT NOTE  Subjective:  Hailey Mckenzie is a 32 y.o. G1P0 at [redacted]w[redacted]d being seen today for ongoing prenatal care.  She is currently monitored for the following issues for this high-risk pregnancy and has Supervision of normal first pregnancy, antepartum; Bipolar disorder (HCC); Joint pain; Transient hypertension of pregnancy in third trimester; and Gestational hypertension on their problem list.  Patient reports facial swelling.   .  .   . Denies leaking of fluid.   The following portions of the patient's history were reviewed and updated as appropriate: allergies, current medications, past family history, past medical history, past social history, past surgical history and problem list. Problem list updated.  RN note: Patient returned for blood pressure check. Patient denies any headaches, dizziness or blurry vision. Armandina Stammer RN    Objective:   Vitals:   10/04/17 1015 10/04/17 1019  BP: (!) 152/96 (!) 144/95  Pulse:  65    Fetal Status:           General:  Alert, oriented and cooperative. Patient is in no acute distress.  Skin: Skin is warm and dry. No rash noted.   Cardiovascular: Normal heart rate noted  Respiratory: Normal respiratory effort, no problems with respiration noted  Abdomen: Soft, gravid, appropriate for gestational age.        Pelvic: Cervical exam deferred        Extremities: Normal range of motion.     Mental Status: Normal mood and affect. Normal behavior. Normal judgment and thought content.   Assessment and Plan:  Pregnancy: G1P0 at [redacted]w[redacted]d  1. Gestational hypertension, third trimester     This is second visit with hypertension.  Latest Urine showed 464 protein/Cr      Discussed this is technically preeclampsia, though mild.        Will send to MAU for repeat labs to verify if stable      Informed would recommend IOL at 37 weeks for this        Preterm labor symptoms and general obstetric precautions including but not limited to  vaginal bleeding, contractions, leaking of fluid and fetal movement were reviewed in detail with the patient. Please refer to After Visit Summary for other counseling recommendations.   RTO 1 week if undelivered  Future Appointments  Date Time Provider Department Center  10/09/2017  3:00 PM Willodean Rosenthal, MD [facility] None  10/15/2017 10:30 AM Aviva Signs, CNM [facility] None  10/23/2017  4:15 PM Anyanwu, Jethro Bastos, MD [facility] None  10/29/2017 10:30 AM Aviva Signs, CNM [facility] None    Wynelle Bourgeois, CNM

## 2017-10-05 ENCOUNTER — Encounter (HOSPITAL_COMMUNITY)
Admission: AD | Disposition: A | Discharge status: Home / Self Care | Payer: Self-pay | Source: Ambulatory Visit | Attending: Obstetrics and Gynecology

## 2017-10-05 ENCOUNTER — Encounter (HOSPITAL_COMMUNITY): Payer: Self-pay | Admitting: Anesthesiology

## 2017-10-05 ENCOUNTER — Inpatient Hospital Stay (HOSPITAL_COMMUNITY): Payer: 59 | Admitting: Anesthesiology

## 2017-10-05 ENCOUNTER — Inpatient Hospital Stay (HOSPITAL_COMMUNITY)
Admission: AD | Admit: 2017-10-05 | Discharge: 2017-10-08 | DRG: 788 | Disposition: A | Discharge status: Home / Self Care | Payer: 59 | Source: Ambulatory Visit | Attending: Obstetrics and Gynecology | Admitting: Obstetrics and Gynecology

## 2017-10-05 ENCOUNTER — Encounter (HOSPITAL_COMMUNITY): Payer: Self-pay | Admitting: Emergency Medicine

## 2017-10-05 ENCOUNTER — Inpatient Hospital Stay (HOSPITAL_COMMUNITY): Payer: 59

## 2017-10-05 ENCOUNTER — Other Ambulatory Visit: Payer: Self-pay

## 2017-10-05 DIAGNOSIS — D649 Anemia, unspecified: Secondary | ICD-10-CM | POA: Diagnosis present

## 2017-10-05 DIAGNOSIS — O1490 Unspecified pre-eclampsia, unspecified trimester: Secondary | ICD-10-CM | POA: Diagnosis present

## 2017-10-05 DIAGNOSIS — Z98891 History of uterine scar from previous surgery: Secondary | ICD-10-CM

## 2017-10-05 DIAGNOSIS — O368390 Maternal care for abnormalities of the fetal heart rate or rhythm, unspecified trimester, not applicable or unspecified: Secondary | ICD-10-CM

## 2017-10-05 DIAGNOSIS — O1414 Severe pre-eclampsia complicating childbirth: Secondary | ICD-10-CM | POA: Diagnosis not present

## 2017-10-05 DIAGNOSIS — O368130 Decreased fetal movements, third trimester, not applicable or unspecified: Principal | ICD-10-CM | POA: Diagnosis present

## 2017-10-05 DIAGNOSIS — O1404 Mild to moderate pre-eclampsia, complicating childbirth: Secondary | ICD-10-CM | POA: Diagnosis present

## 2017-10-05 DIAGNOSIS — Z3A36 36 weeks gestation of pregnancy: Secondary | ICD-10-CM | POA: Diagnosis not present

## 2017-10-05 DIAGNOSIS — O9902 Anemia complicating childbirth: Secondary | ICD-10-CM | POA: Diagnosis present

## 2017-10-05 DIAGNOSIS — O1493 Unspecified pre-eclampsia, third trimester: Secondary | ICD-10-CM

## 2017-10-05 HISTORY — DX: Anxiety disorder, unspecified: F41.9

## 2017-10-05 HISTORY — DX: Depression, unspecified: F32.A

## 2017-10-05 HISTORY — DX: Major depressive disorder, single episode, unspecified: F32.9

## 2017-10-05 LAB — CBC
HCT: 34.5 % — ABNORMAL LOW (ref 36.0–46.0)
HCT: 35.3 % — ABNORMAL LOW (ref 36.0–46.0)
Hemoglobin: 11.4 g/dL — ABNORMAL LOW (ref 12.0–15.0)
Hemoglobin: 11.6 g/dL — ABNORMAL LOW (ref 12.0–15.0)
MCH: 23.3 pg — ABNORMAL LOW (ref 26.0–34.0)
MCH: 23.4 pg — ABNORMAL LOW (ref 26.0–34.0)
MCHC: 33 g/dL (ref 30.0–36.0)
MCHC: 32.9 g/dL (ref 30.0–36.0)
MCV: 70.4 fL — ABNORMAL LOW (ref 78.0–100.0)
MCV: 71.2 fL — ABNORMAL LOW (ref 78.0–100.0)
Platelets: 207 K/uL (ref 150–400)
Platelets: 197 K/uL (ref 150–400)
RBC: 4.9 MIL/uL (ref 3.87–5.11)
RBC: 4.96 MIL/uL (ref 3.87–5.11)
RDW: 15.8 % — ABNORMAL HIGH (ref 11.5–15.5)
RDW: 16 % — ABNORMAL HIGH (ref 11.5–15.5)
WBC: 13.8 K/uL — ABNORMAL HIGH (ref 4.0–10.5)
WBC: 16.8 K/uL — ABNORMAL HIGH (ref 4.0–10.5)

## 2017-10-05 LAB — URINALYSIS, ROUTINE W REFLEX MICROSCOPIC
Bilirubin Urine: NEGATIVE
GLUCOSE, UA: NEGATIVE mg/dL
Hgb urine dipstick: NEGATIVE
Ketones, ur: 5 mg/dL — AB
Leukocytes, UA: NEGATIVE
Nitrite: NEGATIVE
PROTEIN: 100 mg/dL — AB
SPECIFIC GRAVITY, URINE: 1.002 — AB (ref 1.005–1.030)
pH: 6 (ref 5.0–8.0)

## 2017-10-05 LAB — ABO/RH: ABO/RH(D): AB POS

## 2017-10-05 LAB — TYPE AND SCREEN
ABO/RH(D): AB POS
Antibody Screen: NEGATIVE

## 2017-10-05 LAB — RPR: RPR: NONREACTIVE

## 2017-10-05 LAB — CULTURE, BETA STREP (GROUP B ONLY): Strep Gp B Culture: NEGATIVE

## 2017-10-05 SURGERY — Surgical Case
Anesthesia: Epidural | Site: Abdomen | Wound class: Clean Contaminated

## 2017-10-05 MED ORDER — LACTATED RINGERS IV SOLN
500.0000 mL | INTRAVENOUS | Status: DC | PRN
  Administered 2017-10-05 (×4): 500 mL via INTRAVENOUS

## 2017-10-05 MED ORDER — OXYCODONE-ACETAMINOPHEN 5-325 MG PO TABS: 2.0000 | ORAL_TABLET | ORAL | Status: DC | PRN

## 2017-10-05 MED ORDER — SODIUM BICARBONATE 8.4 % IV SOLN
INTRAVENOUS | Status: DC | PRN
  Administered 2017-10-05 (×3): 5 mL via EPIDURAL

## 2017-10-05 MED ORDER — ACETAMINOPHEN 325 MG PO TABS: 650.0000 mg | ORAL_TABLET | ORAL | Status: DC | PRN

## 2017-10-05 MED ORDER — DIPHENHYDRAMINE HCL 50 MG/ML IJ SOLN: 12.5000 mg | INTRAMUSCULAR | Status: DC | PRN

## 2017-10-05 MED ORDER — HYDRALAZINE HCL 20 MG/ML IJ SOLN: 10.0000 mg | Freq: Once | INTRAMUSCULAR | Status: DC | PRN

## 2017-10-05 MED ORDER — PHENYLEPHRINE 40 MCG/ML (10ML) SYRINGE FOR IV PUSH (FOR BLOOD PRESSURE SUPPORT): 80.0000 ug | PREFILLED_SYRINGE | INTRAVENOUS | Status: DC | PRN

## 2017-10-05 MED ORDER — OXYTOCIN 10 UNIT/ML IJ SOLN
INTRAVENOUS | Status: DC | PRN
  Administered 2017-10-05: 40 [IU] via INTRAVENOUS

## 2017-10-05 MED ORDER — NALOXONE HCL 0.4 MG/ML IJ SOLN: 0.4000 mg | INTRAMUSCULAR | Status: DC | PRN

## 2017-10-05 MED ORDER — SODIUM CHLORIDE 0.9 % IV SOLN: 5.0000 10*6.[IU] | Freq: Once | INTRAVENOUS | Status: DC

## 2017-10-05 MED ORDER — DEXAMETHASONE SODIUM PHOSPHATE 10 MG/ML IJ SOLN
INTRAMUSCULAR | Status: AC
  Filled 2017-10-05: qty 1

## 2017-10-05 MED ORDER — BETAMETHASONE SOD PHOS & ACET 6 (3-3) MG/ML IJ SUSP
12.0000 mg | Freq: Once | INTRAMUSCULAR | Status: AC
  Administered 2017-10-05: 12 mg via INTRAMUSCULAR
  Filled 2017-10-05: qty 2

## 2017-10-05 MED ORDER — DEXAMETHASONE SODIUM PHOSPHATE 4 MG/ML IJ SOLN
INTRAMUSCULAR | Status: DC | PRN
  Administered 2017-10-05: 4 mg via INTRAVENOUS

## 2017-10-05 MED ORDER — FENTANYL 2.5 MCG/ML BUPIVACAINE 1/10 % EPIDURAL INFUSION (WH - ANES)
14.0000 mL/h | INTRAMUSCULAR | Status: DC | PRN
  Administered 2017-10-05: 14 mL/h via EPIDURAL
  Filled 2017-10-05: qty 100

## 2017-10-05 MED ORDER — EPHEDRINE 5 MG/ML INJ: 10.0000 mg | INTRAVENOUS | Status: DC | PRN

## 2017-10-05 MED ORDER — PENICILLIN G POT IN DEXTROSE 60000 UNIT/ML IV SOLN: 3.0000 10*6.[IU] | INTRAVENOUS | Status: DC

## 2017-10-05 MED ORDER — KETOROLAC TROMETHAMINE 30 MG/ML IJ SOLN
INTRAMUSCULAR | Status: DC | PRN
  Administered 2017-10-05: 30 mg via INTRAVENOUS

## 2017-10-05 MED ORDER — PHENYLEPHRINE 40 MCG/ML (10ML) SYRINGE FOR IV PUSH (FOR BLOOD PRESSURE SUPPORT)
80.0000 ug | PREFILLED_SYRINGE | INTRAVENOUS | Status: DC | PRN
  Filled 2017-10-05: qty 10

## 2017-10-05 MED ORDER — SCOPOLAMINE 1 MG/3DAYS TD PT72
MEDICATED_PATCH | TRANSDERMAL | Status: DC | PRN
  Administered 2017-10-05: 1 via TRANSDERMAL

## 2017-10-05 MED ORDER — OXYCODONE-ACETAMINOPHEN 5-325 MG PO TABS: 1.0000 | ORAL_TABLET | ORAL | Status: DC | PRN

## 2017-10-05 MED ORDER — SODIUM CHLORIDE 0.9% FLUSH: 3.0000 mL | INTRAVENOUS | Status: DC | PRN

## 2017-10-05 MED ORDER — SOD CITRATE-CITRIC ACID 500-334 MG/5ML PO SOLN: 30.0000 mL | Freq: Once | ORAL | Status: DC

## 2017-10-05 MED ORDER — OXYTOCIN BOLUS FROM INFUSION: 500.0000 mL | Freq: Once | INTRAVENOUS | Status: DC

## 2017-10-05 MED ORDER — LIDOCAINE HCL (PF) 1 % IJ SOLN: 30.0000 mL | INTRAMUSCULAR | Status: DC | PRN

## 2017-10-05 MED ORDER — LACTATED RINGERS IV SOLN
INTRAVENOUS | Status: DC
  Administered 2017-10-05 (×5): via INTRAVENOUS

## 2017-10-05 MED ORDER — ONDANSETRON HCL 4 MG/2ML IJ SOLN
INTRAMUSCULAR | Status: AC
Start: 2017-10-05 — End: ?
  Filled 2017-10-05: qty 2

## 2017-10-05 MED ORDER — SOD CITRATE-CITRIC ACID 500-334 MG/5ML PO SOLN
30.0000 mL | ORAL | Status: DC | PRN
  Administered 2017-10-05: 30 mL via ORAL
  Filled 2017-10-05 (×2): qty 15

## 2017-10-05 MED ORDER — CEFAZOLIN SODIUM-DEXTROSE 2-4 GM/100ML-% IV SOLN
INTRAVENOUS | Status: AC
  Filled 2017-10-05: qty 100

## 2017-10-05 MED ORDER — LACTATED RINGERS IV SOLN: 500.0000 mL | Freq: Once | INTRAVENOUS | Status: DC

## 2017-10-05 MED ORDER — MORPHINE SULFATE (PF) 0.5 MG/ML IJ SOLN
INTRAMUSCULAR | Status: DC | PRN
  Administered 2017-10-05: 4 mg via EPIDURAL

## 2017-10-05 MED ORDER — SCOPOLAMINE 1 MG/3DAYS TD PT72
MEDICATED_PATCH | TRANSDERMAL | Status: AC
  Filled 2017-10-05: qty 1

## 2017-10-05 MED ORDER — LIDOCAINE HCL (PF) 1 % IJ SOLN
INTRAMUSCULAR | Status: DC | PRN
  Administered 2017-10-05 (×2): 4 mL via EPIDURAL

## 2017-10-05 MED ORDER — ONDANSETRON HCL 4 MG/2ML IJ SOLN: 4.0000 mg | Freq: Three times a day (TID) | INTRAMUSCULAR | Status: DC | PRN

## 2017-10-05 MED ORDER — ONDANSETRON HCL 4 MG/2ML IJ SOLN
INTRAMUSCULAR | Status: DC | PRN
Start: 2017-10-05 — End: 2017-10-05
  Administered 2017-10-05: 4 mg via INTRAVENOUS

## 2017-10-05 MED ORDER — SODIUM CHLORIDE 0.9 % IV SOLN
500.0000 mg | Freq: Once | INTRAVENOUS | Status: AC
  Administered 2017-10-05: 500 mg via INTRAVENOUS
  Filled 2017-10-05: qty 500

## 2017-10-05 MED ORDER — KETOROLAC TROMETHAMINE 30 MG/ML IJ SOLN: 30.0000 mg | Freq: Four times a day (QID) | INTRAMUSCULAR | Status: AC | PRN

## 2017-10-05 MED ORDER — OXYTOCIN 10 UNIT/ML IJ SOLN
INTRAMUSCULAR | Status: AC
  Filled 2017-10-05: qty 4

## 2017-10-05 MED ORDER — ONDANSETRON HCL 4 MG/2ML IJ SOLN: 4.0000 mg | Freq: Four times a day (QID) | INTRAMUSCULAR | Status: DC | PRN

## 2017-10-05 MED ORDER — MORPHINE SULFATE (PF) 0.5 MG/ML IJ SOLN
INTRAMUSCULAR | Status: AC
  Filled 2017-10-05: qty 10

## 2017-10-05 MED ORDER — OXYTOCIN 40 UNITS IN LACTATED RINGERS INFUSION - SIMPLE MED
1.0000 m[IU]/min | INTRAVENOUS | Status: DC
  Administered 2017-10-05: 1 m[IU]/min via INTRAVENOUS
  Filled 2017-10-05: qty 1000

## 2017-10-05 MED ORDER — TERBUTALINE SULFATE 1 MG/ML IJ SOLN: 0.2500 mg | Freq: Once | INTRAMUSCULAR | Status: DC | PRN

## 2017-10-05 MED ORDER — CEFAZOLIN SODIUM-DEXTROSE 2-4 GM/100ML-% IV SOLN
2.0000 g | Freq: Once | INTRAVENOUS | Status: AC
  Administered 2017-10-05: 2 g via INTRAVENOUS

## 2017-10-05 MED ORDER — LABETALOL HCL 5 MG/ML IV SOLN
20.0000 mg | INTRAVENOUS | Status: DC | PRN
  Administered 2017-10-05: 40 mg via INTRAVENOUS
  Administered 2017-10-05: 20 mg via INTRAVENOUS
  Filled 2017-10-05: qty 4
  Filled 2017-10-05: qty 8

## 2017-10-05 MED ORDER — LACTATED RINGERS IV SOLN: INTRAVENOUS | Status: DC

## 2017-10-05 MED ORDER — OXYTOCIN 40 UNITS IN LACTATED RINGERS INFUSION - SIMPLE MED: 2.5000 [IU]/h | INTRAVENOUS | Status: DC

## 2017-10-05 MED ORDER — KETOROLAC TROMETHAMINE 30 MG/ML IJ SOLN
INTRAMUSCULAR | Status: AC
  Filled 2017-10-05: qty 1

## 2017-10-05 MED ORDER — FENTANYL CITRATE (PF) 100 MCG/2ML IJ SOLN: 50.0000 ug | INTRAMUSCULAR | Status: DC | PRN

## 2017-10-05 SURGICAL SUPPLY — 40 items
BENZOIN TINCTURE PRP APPL 2/3 (GAUZE/BANDAGES/DRESSINGS) ×2 IMPLANT
CHLORAPREP W/TINT 26ML (MISCELLANEOUS) ×2 IMPLANT
CLAMP CORD UMBIL (MISCELLANEOUS) IMPLANT
CLOTH BEACON ORANGE TIMEOUT ST (SAFETY) ×2 IMPLANT
DRAPE C SECTION CLR SCREEN (DRAPES) IMPLANT
DRSG OPSITE POSTOP 4X10 (GAUZE/BANDAGES/DRESSINGS) ×2 IMPLANT
ELECT REM PT RETURN 9FT ADLT (ELECTROSURGICAL) ×2
ELECTRODE REM PT RTRN 9FT ADLT (ELECTROSURGICAL) ×1 IMPLANT
EXTRACTOR VACUUM M CUP 4 TUBE (SUCTIONS) IMPLANT
GAUZE SPONGE 4X4 12PLY STRL LF (GAUZE/BANDAGES/DRESSINGS) ×2 IMPLANT
GLOVE BIO SURGEON STRL SZ7.5 (GLOVE) ×4 IMPLANT
GLOVE BIOGEL PI IND STRL 7.0 (GLOVE) ×5 IMPLANT
GLOVE BIOGEL PI INDICATOR 7.0 (GLOVE) ×5
GOWN STRL REUS W/TWL 2XL LVL3 (GOWN DISPOSABLE) ×2 IMPLANT
GOWN STRL REUS W/TWL LRG LVL3 (GOWN DISPOSABLE) ×6 IMPLANT
KIT ABG SYR 3ML LUER SLIP (SYRINGE) IMPLANT
NEEDLE HYPO 22GX1.5 SAFETY (NEEDLE) ×2 IMPLANT
NEEDLE HYPO 25X5/8 SAFETYGLIDE (NEEDLE) IMPLANT
NS IRRIG 1000ML POUR BTL (IV SOLUTION) ×2 IMPLANT
PACK C SECTION WH (CUSTOM PROCEDURE TRAY) ×2 IMPLANT
PAD ABD 7.5X8 STRL (GAUZE/BANDAGES/DRESSINGS) ×2 IMPLANT
PAD ABD 8X10 STRL (GAUZE/BANDAGES/DRESSINGS) ×2 IMPLANT
PAD OB MATERNITY 4.3X12.25 (PERSONAL CARE ITEMS) ×2 IMPLANT
PENCIL SMOKE EVAC W/HOLSTER (ELECTROSURGICAL) ×2 IMPLANT
RTRCTR C-SECT PINK 25CM LRG (MISCELLANEOUS) ×2 IMPLANT
STRIP CLOSURE SKIN 1/2X4 (GAUZE/BANDAGES/DRESSINGS) ×2 IMPLANT
SUT CHROMIC 0 CTX 36 (SUTURE) ×2 IMPLANT
SUT CHROMIC 1 CTX 36 (SUTURE) ×4 IMPLANT
SUT VIC AB 1 CT1 36 (SUTURE) ×4 IMPLANT
SUT VIC AB 2-0 CT1 (SUTURE) ×2 IMPLANT
SUT VIC AB 2-0 CT1 27 (SUTURE) ×1
SUT VIC AB 2-0 CT1 TAPERPNT 27 (SUTURE) ×1 IMPLANT
SUT VIC AB 3-0 CT1 27 (SUTURE) ×2
SUT VIC AB 3-0 CT1 TAPERPNT 27 (SUTURE) ×2 IMPLANT
SUT VIC AB 3-0 SH 27 (SUTURE)
SUT VIC AB 3-0 SH 27X BRD (SUTURE) IMPLANT
SUT VIC AB 4-0 KS 27 (SUTURE) ×2 IMPLANT
SYR BULB IRRIGATION 50ML (SYRINGE) IMPLANT
TOWEL OR 17X24 6PK STRL BLUE (TOWEL DISPOSABLE) ×2 IMPLANT
TRAY FOLEY W/BAG SLVR 14FR LF (SET/KITS/TRAYS/PACK) ×2 IMPLANT

## 2017-10-05 NOTE — Progress Notes (Signed)
Dr. Alysia Penna and Dr. Doroteo Glassman at bedside and explained to patient that Pitocin was going to be started to increase contractions. Risks and benefits were explained. Patient is aware that baby has been having decelerations and that we may have to go back for a cesarean section. Dr. Doroteo Glassman started the Pitocin @ 1 miliunit/hr.

## 2017-10-05 NOTE — MAU Note (Addendum)
Unable to sleep tonight. Realized have not felt FM for 5-6hrs. Swelling in ankles. B/P elevated yesterday when in MAU. Denies LOF or bleeding. PT told yest she has Preeclampsia

## 2017-10-05 NOTE — Progress Notes (Signed)
Upon initial assessment of the patient, she states that she has been having headaches.  First BP after arriving to the unit was in the severe range at 161/84, repeat BP was 155/90.  1 beat of clonus assessed.  Baby has minimal variability with lates.  Dr. Doroteo Glassman updated on pt vital signs, objective assessment findings, and FHR.  Orders placed for hypertension protocol.  Dr. Malen Gauze also updated on pt's situation.

## 2017-10-05 NOTE — Progress Notes (Signed)
Labor Progress Note  Hailey Mckenzie is a 32 y.o. G1P0 at [redacted]w[redacted]d admitted for induction of labor due to Non-reactive NST and BPP 4/8. Also has preeclampsia  S: Doing well. More uncomfortable with contractions. Denies PIH symptoms (HA, scotomata, RUQ pain)  O:  BP 120/68   Pulse 75   Temp 98.7 F (37.1 C) (Oral)   Resp 18   Ht  (1.676 m)   Wt 156 lb (70.8 kg)   LMP 01/23/2017 (Exact Date)   SpO2 100%   BMI 25.18 kg/m   No intake/output data recorded.  FHT:  FHR: 160 bpm, variability: moderate,  accelerations:  Abscent,  decelerations:  Present variable UC:   regular, every 3-6 minutes SVE:   Dilation: 5.5 Effacement (%): 70 Station: -3 Exam by:: Dr. Doroteo Glassman, MD  Labs: Lab Results  Component Value Date   WBC 13.8 (H) 10/05/2017   HGB 11.4 (L) 10/05/2017   HCT 34.5 (L) 10/05/2017   MCV 70.4 (L) 10/05/2017   PLT 207 10/05/2017    Assessment / Plan: 32 y.o. G1P0 [redacted]w[redacted]d in early labor Induction of labor due to [facility] 4/8  Labor: Progressing normally. S/p FB. Pitocin started; continue to titrate as needed for adequate labor. AROM with placement of internal monitors Preeclampsia: Asymptomatic, BPs improved. Fetal Wellbeing:  Category II Pain Control:  Labor support without medications Anticipated MOD:  NSVD  Caryl Ada, DO OB Fellow Center for [facility], [facility]

## 2017-10-05 NOTE — Anesthesia Procedure Notes (Signed)
Epidural Patient location during procedure: OB Start time: 10/05/2017 9:50 PM  Staffing Anesthesiologist: Mal Amabile, MD  Preanesthetic Checklist Completed: patient identified, site marked, surgical consent, pre-op evaluation, timeout performed, IV checked, risks and benefits discussed and monitors and equipment checked  Epidural Patient position: sitting Prep: site prepped and draped and DuraPrep Patient monitoring: continuous pulse ox and blood pressure Approach: midline Location: L3-L4 Injection technique: LOR air  Needle:  Needle type: Tuohy  Needle gauge: 17 G Needle length: 9 cm and 9 Needle insertion depth: 4 cm Catheter type: closed end flexible Catheter size: 19 Gauge Catheter at skin depth: 9 cm Test dose: negative and Other  Assessment Events: blood not aspirated, injection not painful, no injection resistance, negative IV test and no paresthesia  Additional Notes Patient identified. Risks and benefits discussed including failed block, incomplete  Pain control, post dural puncture headache, nerve damage, paralysis, blood pressure Changes, nausea, vomiting, reactions to medications-both toxic and allergic and post Partum back pain. All questions were answered. Patient expressed understanding and wished to proceed. Sterile technique was used throughout procedure. Epidural site was Dressed with sterile barrier dressing. No paresthesias, signs of intravascular injection Or signs of intrathecal spread were encountered.  Patient was more comfortable after the epidural was dosed. Please see RN's note for documentation of vital signs and FHR which are stable.

## 2017-10-05 NOTE — Anesthesia Preprocedure Evaluation (Deleted)
Anesthesia Evaluation    Airway Mallampati: II  TM Distance: >3 FB Neck ROM: Full    Dental no notable dental hx. (+) Teeth Intact   Pulmonary asthma ,  Remote hx/o asthma   Pulmonary exam normal breath sounds clear to auscultation       Cardiovascular hypertension,  Rhythm:Regular Rate:Bradycardia  Gestational HTN on no Rx   Neuro/Psych PSYCHIATRIC DISORDERS Anxiety Depression Bipolar Disorder negative neurological ROS     GI/Hepatic negative GI ROS, Neg liver ROS,   Endo/Other  negative endocrine ROS  Renal/GU negative Renal ROS  negative genitourinary   Musculoskeletal negative musculoskeletal ROS (+)   Abdominal   Peds  Hematology  (+) Blood dyscrasia, anemia , Thalassemia trait   Anesthesia Other Findings   Reproductive/Obstetrics (+) Pregnancy                             Anesthesia Physical Anesthesia Plan  ASA: II  Anesthesia Plan: Spinal and General   Post-op Pain Management:    Induction:   PONV Risk Score and Plan:   Airway Management Planned:   Additional Equipment:   Intra-op Plan:   Post-operative Plan:   Informed Consent:   Dental advisory given  Plan Discussed with: CRNA, Anesthesiologist and Surgeon  Anesthesia Plan Comments: (Patient is planning a water birth but is having repetitive late decelerations Class 2 tracing, and has BPP of 2/8. She was not planning on having an epidural for labor. I discussed with her the possibility of a C/Section. I discussed the risks, benefits and alternatives of GA vs RA. She appears to understand. Questions were answered.)        Anesthesia Quick Evaluation

## 2017-10-05 NOTE — Progress Notes (Signed)
Labor Progress Note  Hailey Mckenzie is a 32 y.o. G1P0 at [redacted]w[redacted]d admitted for induction of labor due to Non-reactive NST and BPP 4/8. Also has preeclampsia  S: Doing well. Feels contractions. Denies PIH symptoms (HA, scotomata, RUQ pain)  O:  BP 119/70   Pulse 64   Temp 97.7 F (36.5 C) (Oral)   Resp 18   Ht  (1.676 m)   Wt 156 lb (70.8 kg)   LMP 01/23/2017 (Exact Date)   SpO2 100%   BMI 25.18 kg/m   No intake/output data recorded.  FHT:  FHR: 145 bpm, variability: moderate,  accelerations:  Abscent,  decelerations:  Present variable, late UC:   regular, every 3-6 minutes SVE:   Dilation: 5 Effacement (%): 50 Station: Ballotable Exam by:: Doroteo Glassman, MD  Labs: Lab Results  Component Value Date   WBC 13.8 (H) 10/05/2017   HGB 11.4 (L) 10/05/2017   HCT 34.5 (L) 10/05/2017   MCV 70.4 (L) 10/05/2017   PLT 207 10/05/2017    Assessment / Plan: 32 y.o. G1P0 [redacted]w[redacted]d in early labor Induction of labor due to [facility] 4/8  Labor: Progressing normally. S/p FB. Will try and start pitocin when strip allows Preeclampsia: Asymptomatic, BPs improved. Fetal Wellbeing:  Category II Pain Control:  Labor support without medications Anticipated MOD:  NSVD  Caryl Ada, DO OB Fellow Center for [facility], [facility]

## 2017-10-05 NOTE — Progress Notes (Signed)
Patient ID: Hailey Mckenzie, female   DOB: 1986/03/31, 32 y.o.   MRN: [id number] OB Attending  Pt having repeative delcerations with ut ctx now despite amnioinfusion. Cervix unchanged C section recommended to pt. R/B/Post op care reviewed Nursing and anesthesia aware.

## 2017-10-05 NOTE — Transfer of Care (Signed)
Immediate Anesthesia Transfer of Care Note  Patient: Hailey Mckenzie  Procedure(s) Performed: CESAREAN SECTION (N/A Abdomen)  Patient Location: PACU  Anesthesia Type:Epidural  Level of Consciousness: awake  Airway & Oxygen Therapy: Patient Spontanous Breathing  Post-op Assessment: Report given to RN  Post vital signs: Reviewed  Last Vitals:  Vitals Value Taken Time  BP    Temp    Pulse 91 10/05/2017 11:13 PM  Resp 16 10/05/2017 11:13 PM  SpO2 100 % 10/05/2017 11:13 PM  Vitals shown include unvalidated device data.  Last Pain:  Vitals:   10/05/17 2102  TempSrc: Oral  PainSc:          Complications: No apparent anesthesia complications

## 2017-10-05 NOTE — Anesthesia Pain Management Evaluation Note (Signed)
  CRNA Pain Management Visit Note  Patient: Hailey Mckenzie, 32 y.o., female  "Hello I am a member of the anesthesia team at [facility]. We have an anesthesia team available at all times to provide care throughout the hospital, including epidural management and anesthesia for C-section. I don't know your plan for the delivery whether it a natural birth, water birth, IV sedation, nitrous supplementation, doula or epidural, but we want to meet your pain goals."   1.Was your pain managed to your expectations on prior hospitalizations?   No   2.What is your expectation for pain management during this hospitalization?     Labor support without medications, Water tub and Doula  3.How can we help you reach that goal? Waterbirth  Record the patient's initial score and the patient's pain goal.   Pain: 6  Pain Goal: 10 [facility] wants you to be able to say your pain was always managed very well.  Rica Records 10/05/2017

## 2017-10-05 NOTE — Anesthesia Preprocedure Evaluation (Addendum)
Anesthesia Evaluation  Patient identified by MRN, date of birth, ID band Patient awake    Reviewed: Allergy & Precautions, NPO status , Patient's Chart, lab work & pertinent test results, reviewed documented beta blocker date and time   Airway Mallampati: II  TM Distance: >3 FB Neck ROM: Full    Dental no notable dental hx. (+) Teeth Intact   Pulmonary asthma ,  Remote hx/o asthma   Pulmonary exam normal breath sounds clear to auscultation       Cardiovascular hypertension, Pt. on medications and Pt. on home beta blockers  Rhythm:Regular Rate:Bradycardia  Gestational HTN being given Labetalol   Neuro/Psych PSYCHIATRIC DISORDERS Anxiety Depression Bipolar Disorder negative neurological ROS     GI/Hepatic negative GI ROS, Neg liver ROS,   Endo/Other  negative endocrine ROS  Renal/GU negative Renal ROS  negative genitourinary   Musculoskeletal negative musculoskeletal ROS (+)   Abdominal   Peds  Hematology  (+) Blood dyscrasia, anemia , Thalassemia trait   Anesthesia Other Findings   Reproductive/Obstetrics (+) Pregnancy 36 weeks                            Anesthesia Physical  Anesthesia Plan  ASA: III and emergent  Anesthesia Plan: Epidural   Post-op Pain Management:    Induction:   PONV Risk Score and Plan:   Airway Management Planned: Natural Airway  Additional Equipment:   Intra-op Plan:   Post-operative Plan:   Informed Consent: I have reviewed the patients History and Physical, chart, labs and discussed the procedure including the risks, benefits and alternatives for the proposed anesthesia with the patient or authorized representative who has indicated his/her understanding and acceptance.   Dental advisory given  Plan Discussed with: CRNA, Anesthesiologist and Surgeon  Anesthesia Plan Comments: (Patient for C/section for non reassuring FHR tracing. Will use  Epidural for C/section. M. Malen Gauze,  MD)       Anesthesia Quick Evaluation

## 2017-10-05 NOTE — H&P (Signed)
Obstetric History and Physical  Hailey Mckenzie is a 32 y.o. G1P0 with IUP at [redacted]w[redacted]d presenting for IOL for decreased FM with BPP 4/10. Patient states she has been having  Regular but mild, none vaginal bleeding, intact membranes, with decreased  fetal movement.    Patient recently diagnosed with preeclampsia without severe features based of elevated p/c ration. No blurry vision, headaches or RUQ pain. Endorsing peripheral edema that started last night.   Prenatal Course Source of Care: [facility] Dating: By LMP c/w [place] --->  Estimated Date of Delivery: 10/30/17 Pregnancy complications or risks: Patient Active Problem List   Diagnosis Date Noted  . Pre-eclampsia 10/05/2017  . Gestational hypertension 10/04/2017  . Transient hypertension of pregnancy in third trimester 10/01/2017  . Supervision of normal first pregnancy, antepartum 04/12/2017  . Bipolar disorder (HCC) 01/31/2015  . Joint pain 01/31/2015   She plans to breastfeed She desires Nexplanon for postpartum contraception.   Sono:    , CWD,incomplete anatomy views of heart otherwise normal, variable presentation, posterior placenta, 314g, 48% EFW  Prenatal labs and studies: ABO, Rh: --/--/AB POS (05/25 0750) Antibody: PENDING (05/25 0750) Rubella: 9.43 (11/30 1110) RPR: Non Reactive (03/27 0932)  HBsAg: Negative (11/30 1110)  HIV: Non Reactive (03/27 0932)  [id number] (05/24 0000) 2 hr Glucola normal Genetic screening declined Anatomy US limited views of heart, otherwise normal  Prenatal Transfer Tool  Maternal Diabetes: No Genetic Screening: Declined Maternal Ultrasounds/Referrals: Normal Fetal Ultrasounds or other Referrals:  None Maternal Substance Abuse:  No Significant Maternal Medications:  None Significant Maternal Lab Results: None  Past Medical History:  Diagnosis Date  . Anxiety   . Asthma   . Depression   . Pregnancy induced hypertension   . Thalassemia trait     Past Surgical History:   Procedure Laterality Date  . WISDOM TOOTH EXTRACTION      OB History  Gravida Para Term Preterm AB Living  1            SAB TAB Ectopic Multiple Live Births               # Outcome Date GA Lbr Len/2nd Weight Sex Delivery Anes PTL Lv  1 Current             Social History   Socioeconomic History  . Marital status: Single    Spouse name: Not on file  . Number of children: Not on file  . Years of education: Not on file  . Highest education level: Not on file  Occupational History  . Not on file  Social Needs  . Financial resource strain: Not on file  . Food insecurity:    Worry: Not on file    Inability: Not on file  . Transportation needs:    Medical: Not on file    Non-medical: Not on file  Tobacco Use  . Smoking status: Never Smoker  . Smokeless tobacco: Never Used  Substance and Sexual Activity  . Alcohol use: Yes    Alcohol/week: 0.6 oz    Types: 1 Glasses of wine per week  . Drug use: No  . Sexual activity: Yes  Lifestyle  . Physical activity:    Days per week: Not on file    Minutes per session: Not on file  . Stress: Not on file  Relationships  . Social connections:    Talks on phone: Not on file    Gets together: Not on file    Attends religious service: Not on  file    Active member of club or organization: Not on file    Attends meetings of clubs or organizations: Not on file    Relationship status: Not on file  Other Topics Concern  . Not on file  Social History Narrative  . Not on file    Family History  Problem Relation Age of Onset  . Hypertension Mother   . Asthma Father   . Hypertension Maternal Grandmother   . Cancer Neg Hx   . Diabetes Neg Hx   . Stroke Neg Hx     Medications Prior to Admission  Medication Sig Dispense Refill Last Dose  . prenatal vitamin w/FE, FA (PRENATAL 1 + 1) 27-1 MG TABS tablet Take 1 tablet by mouth daily at 12 noon.   10/04/2017 at Unknown time  . Vitamin D, Ergocalciferol, (DRISDOL) 50000 units CAPS  capsule Take 1 capsule (50,000 Units total) by mouth every 7 (seven) days. 30 capsule 0 Past Week at Unknown time    No Known Allergies  Review of Systems: Negative except for what is mentioned in HPI.  Physical Exam: BP (!) 155/90 (BP Location: Left Arm)   Pulse (!) 54   Temp 98.2 F (36.8 C) (Oral)   Resp 16   Ht  (1.676 m)   Wt 156 lb (70.8 kg)   LMP 01/23/2017 (Exact Date)   SpO2 98%   BMI 25.18 kg/m  CONSTITUTIONAL: Well-developed, well-nourished female in no acute distress.  HENT:  Normocephalic, atraumatic, External right and left ear normal. Oropharynx is clear and moist EYES: Conjunctivae and EOM are normal. Pupils are equal, round, and reactive to light. No scleral icterus.  NECK: Normal range of motion, supple, no masses SKIN: Skin is warm and dry. No rash noted. Not diaphoretic. No erythema. No pallor. NEUROLOGIC: Alert and oriented to person, place, and time. Normal reflexes, muscle tone coordination. No cranial nerve deficit noted. PSYCHIATRIC: Normal mood and affect. Normal behavior. Normal judgment and thought content. CARDIOVASCULAR: Normal heart rate noted, regular rhythm RESPIRATORY: Effort and breath sounds normal, no problems with respiration noted ABDOMEN: Soft, nontender, nondistended, gravid. MUSCULOSKELETAL: Normal range of motion. No edema and no tenderness. 2+ distal pulses.  Cervical Exam: Dilation: 2 Effacement (%): 80 Station: -3 Presentation: Vertex Exam by:: Doroteo Glassman, MD  FHT:  Baseline rate 140 bpm   Variability minimal, moderate  Accelerations absent   Decelerations ?late Contractions: Every 4-7 mins   Pertinent Labs/Studies:   Results for orders placed or performed during the hospital encounter of 10/05/17 (from the past 24 hour(s))  Urinalysis, Routine w reflex microscopic     Status: Abnormal   Collection Time: 10/05/17  6:00 AM  Result Value Ref Range   Color, Urine COLORLESS (A) YELLOW   APPearance CLEAR CLEAR   Specific  Gravity, Urine 1.002 (L) 1.005 - 1.030   pH 6.0 5.0 - 8.0   Glucose, UA NEGATIVE NEGATIVE mg/dL   Hgb urine dipstick NEGATIVE NEGATIVE   Bilirubin Urine NEGATIVE NEGATIVE   Ketones, ur 5 (A) NEGATIVE mg/dL   Protein, ur 161 (A) NEGATIVE mg/dL   Nitrite NEGATIVE NEGATIVE   Leukocytes, UA NEGATIVE NEGATIVE   RBC / HPF 0-5 0 - 5 RBC/hpf   WBC, UA 0-5 0 - 5 WBC/hpf   Bacteria, UA RARE (A) NONE SEEN   Squamous Epithelial / LPF 0-5 0 - 5   Mucus PRESENT   CBC     Status: Abnormal   Collection Time: 10/05/17  7:50 AM  Result Value Ref Range   WBC 13.8 (H) 4.0 - 10.5 K/uL   RBC 4.90 3.87 - 5.11 MIL/uL   Hemoglobin 11.4 (L) 12.0 - 15.0 g/dL   HCT 16.1 (L) 09.6 - 04.5 %   MCV 70.4 (L) 78.0 - 100.0 fL   MCH 23.3 (L) 26.0 - 34.0 pg   MCHC 33.0 30.0 - 36.0 g/dL   RDW 40.9 (H) 81.1 - 91.4 %   Platelets 207 150 - 400 K/uL  Type and screen [facility]     Status: None (Preliminary result)   Collection Time: 10/05/17  7:50 AM  Result Value Ref Range   ABO/RH(D) AB POS    Antibody Screen PENDING    Sample Expiration      10/08/2017 Performed at [facility], [address] [place], [place] [address]     Assessment : Hailey Mckenzie is a 32 y.o. G1P0 at [redacted]w[redacted]d being admitted for induction of labor due to BPP 4/10 with decreased FM and mild preeclampsia. Negative CST.  Plan: Labor: FB placed for induction of labor. Will hold off on medications at this time.   Preeclampsia: mild; no severe features. P/C ratio 0.6. One elevated severe range BP but with resolution 10 minutes later without medication. Will monitor BP and UOP. If another severe range BP will start Mag.  Analgesia as needed. Prefers less interventions.  FWB: Category 2 tracing GBS negative Delivery plan: Hopeful for vaginal delivery   Caryl Ada, DO OB Fellow Faculty Practice, [facility] 10/05/2017, 8:38 AM

## 2017-10-05 NOTE — Op Note (Signed)
Cesarean Section Procedure Note  10/05/2017  11:00 PM  PATIENT:  Hailey Mckenzie  32 y.o. female  PRE-OPERATIVE DIAGNOSIS:  Unscheduled Cesarean Section, NRFHT, Fetal intolerance to induction, preeclampsia  POST-OPERATIVE DIAGNOSIS:  same  PROCEDURE:  Procedure(s): CESAREAN SECTION (N/A)  SURGEON:  Surgeon(s) and Role:    * Hermina Staggers, MD - Primary    * Pincus Large, DO - Assistant  An experienced assistant was required given the standard of surgical care given the complexity of the case.  This assistant was needed for exposure, dissection, suctioning, retraction, instrument exchange, assisting with delivery with administration of fundal pressure, and for overall help during the procedure.  ANESTHESIA:   epidural  EBL:  Total I/O In: 700 [I.V.:700] Out: 687 [Urine:300; Blood:387]  BLOOD ADMINISTERED:none  DRAINS: Urinary Catheter (Foley)   LOCAL MEDICATIONS USED:  NONE  SPECIMEN:  Placenta  DISPOSITION OF SPECIMEN:  PATHOLOGY   Procedure Details   The patient was seen in the Holding Room. The risks, benefits, complications, treatment options, and expected outcomes were discussed with the patient.  The patient concurred with the proposed plan, giving informed consent.  The site of surgery properly noted/marked. The patient was taken to Operating Room # 9, identified as Hailey Mckenzie and the procedure verified as C-Section Delivery. A Time Out was held and the above information confirmed.  After induction of anesthesia, the patient was draped and prepped in the usual sterile manner. A Pfannenstiel incision was made and carried down through the subcutaneous tissue to the fascia. Fascial incision was made and extended transversely. The fascia was separated from the underlying rectus tissue superiorly and inferiorly. The peritoneum was identified and entered. Peritoneal incision was extended longitudinally. Alexis retractor and bladder blade inserted. A low transverse  uterine incision was made. Delivered from cephalic presentation was a Female with Apgar scores of 8 at one minute and 9 at five minutes. After the umbilical cord was clamped and cut cord blood was obtained for evaluation. The placenta was removed intact and appeared normal. The uterine outline, tubes and ovaries appeared normal. The uterine incision was closed with running locked sutures of 0-Chromic. Imbricating second layer placed with 0-Chromic. Hemostasis was observed. Area was cleared of clots. The peritoneum was then reapproxiamated with 2-0 Vicryl in running fashion. The fascia was then reapproximated with running sutures of 0- Vicryl. The skin was reapproximated with 4-0 Vicryl.  Instrument, sponge, and needle counts were correct prior the abdominal closure and at the conclusion of the case.   Complications:  None; patient tolerated the procedure well.  COUNTS:  YES  PLAN OF CARE: Transfer to postpartum  PATIENT DISPOSITION:  PACU - hemodynamically stable.   Delay start of Pharmacological VTE agent (>24hrs) due to surgical blood loss or risk of bleeding: no             Disposition: PACU - hemodynamically stable.         Condition: stable   Pincus Large, DO 10/05/2017 11:00 PM

## 2017-10-05 NOTE — MAU Provider Note (Signed)
  History     CSN: [id number]  Arrival date and time: 10/05/17 4098   First Provider Initiated Contact with Patient 10/05/17 0615      Chief Complaint  Patient presents with  . Decreased Fetal Movement   Hailey Mckenzie is a 32 y.o. G1P0 at [redacted]w[redacted]d who presents today with decreased fetal movement. She is currently monitored for pre-eclampsia without severe features. She denies any HA, visual disturbances, RUQ pain, LOF or VB. She has had contractions off an on. She was supposed to return later today around 1600 for BMZ, and IOL on 10/09/17. She reports that she usually feels the baby move around 2200, but she didn't feel the baby move at that time last night.      Past Medical History:  Diagnosis Date  . Asthma   . Pregnancy induced hypertension   . Thalassemia trait     Past Surgical History:  Procedure Laterality Date  . WISDOM TOOTH EXTRACTION      Family History  Problem Relation Age of Onset  . Hypertension Mother   . Asthma Father   . Hypertension Maternal Grandmother   . Cancer Neg Hx   . Diabetes Neg Hx   . Stroke Neg Hx     Social History   Tobacco Use  . Smoking status: Never Smoker  . Smokeless tobacco: Never Used  Substance Use Topics  . Alcohol use: Yes    Alcohol/week: 0.6 oz    Types: 1 Glasses of wine per week  . Drug use: No    Allergies: No Known Allergies  Medications Prior to Admission  Medication Sig Dispense Refill Last Dose  . prenatal vitamin w/FE, FA (PRENATAL 1 + 1) 27-1 MG TABS tablet Take 1 tablet by mouth daily at 12 noon.   10/04/2017 at Unknown time  . Vitamin D, Ergocalciferol, (DRISDOL) 50000 units CAPS capsule Take 1 capsule (50,000 Units total) by mouth every 7 (seven) days. 30 capsule 0 Past Week at Unknown time    Review of Systems  Constitutional: Negative for chills and fever.  Eyes: Negative for visual disturbance.  Gastrointestinal: Negative for nausea and vomiting.  Genitourinary: Negative for vaginal bleeding and  vaginal discharge.  Neurological: Negative for headaches.   Physical Exam   Blood pressure (!) 147/78, pulse (!) 56, temperature 98.5 F (36.9 C), resp. rate 18, height  (1.676 m), weight 156 lb (70.8 kg), last menstrual period 01/23/2017.  Physical Exam  Nursing note and vitals reviewed. Constitutional: She is oriented to person, place, and time. She appears well-developed and well-nourished. No distress.  HENT:  Head: Normocephalic.  Cardiovascular: Normal rate.  Respiratory: Effort normal.  GI: Soft. There is no tenderness.  Neurological: She is alert and oriented to person, place, and time.  Skin: Skin is warm and dry.  Psychiatric: She has a normal mood and affect.   FHT: 140, minimal, no accels, variable decelerations, late decelerations.  Toco: occasional contractions   BPP: 4/8 (no tone, no movement, +breathing, fluid low normal)  MAU Course  Procedures  MDM 0735 DW Dr. Burnice Logan- Katrinka Blazing, will admit to labor and delivery. Give 2nd BMZ now.   Assessment and Plan   1. Pre-eclampsia in third trimester   2. [redacted] weeks gestation of pregnancy   3. Non-reassuring fetal heart rate or rhythm affecting management of mother    Admit to labor and delivery BMZ now GBS negative NPO for now  Thressa Sheller 10/05/2017, 6:16 AM

## 2017-10-06 ENCOUNTER — Encounter (HOSPITAL_COMMUNITY): Payer: Self-pay | Admitting: *Deleted

## 2017-10-06 LAB — CBC
HCT: 33.3 % — ABNORMAL LOW (ref 36.0–46.0)
HCT: 34.7 % — ABNORMAL LOW (ref 36.0–46.0)
HEMOGLOBIN: 11.4 g/dL — AB (ref 12.0–15.0)
Hemoglobin: 11.1 g/dL — ABNORMAL LOW (ref 12.0–15.0)
MCH: 23.5 pg — ABNORMAL LOW (ref 26.0–34.0)
MCH: 23.7 pg — ABNORMAL LOW (ref 26.0–34.0)
MCHC: 32.9 g/dL (ref 30.0–36.0)
MCHC: 33.3 g/dL (ref 30.0–36.0)
MCV: 71.2 fL — AB (ref 78.0–100.0)
MCV: 71.4 fL — AB (ref 78.0–100.0)
PLATELETS: 182 10*3/uL (ref 150–400)
PLATELETS: 198 10*3/uL (ref 150–400)
RBC: 4.68 MIL/uL (ref 3.87–5.11)
RBC: 4.86 MIL/uL (ref 3.87–5.11)
RDW: 16 % — AB (ref 11.5–15.5)
RDW: 16.1 % — ABNORMAL HIGH (ref 11.5–15.5)
WBC: 20.6 10*3/uL — AB (ref 4.0–10.5)
WBC: 27.8 10*3/uL — AB (ref 4.0–10.5)

## 2017-10-06 LAB — CREATININE, SERUM
CREATININE: 0.78 mg/dL (ref 0.44–1.00)
GFR calc Af Amer: >60 mL/min (ref >60)
GFR calc non Af Amer: >60 mL/min (ref >60)

## 2017-10-06 MED ORDER — PRENATAL MULTIVITAMIN CH
1.0000 | ORAL_TABLET | Freq: Every day | ORAL | Status: DC
  Administered 2017-10-06 – 2017-10-08 (×3): 1 via ORAL
  Filled 2017-10-06 (×3): qty 1

## 2017-10-06 MED ORDER — LABETALOL HCL 5 MG/ML IV SOLN: 20.0000 mg | INTRAVENOUS | Status: DC | PRN

## 2017-10-06 MED ORDER — HYDRALAZINE HCL 20 MG/ML IJ SOLN: 10.0000 mg | Freq: Once | INTRAMUSCULAR | Status: DC | PRN

## 2017-10-06 MED ORDER — SENNOSIDES-DOCUSATE SODIUM 8.6-50 MG PO TABS
2.0000 | ORAL_TABLET | ORAL | Status: DC
  Administered 2017-10-06: 2 via ORAL
  Filled 2017-10-06 (×2): qty 2

## 2017-10-06 MED ORDER — IBUPROFEN 600 MG PO TABS
600.0000 mg | ORAL_TABLET | Freq: Four times a day (QID) | ORAL | Status: DC
  Administered 2017-10-07 – 2017-10-08 (×6): 600 mg via ORAL
  Filled 2017-10-06 (×6): qty 1

## 2017-10-06 MED ORDER — MAGNESIUM SULFATE 40 G IN LACTATED RINGERS - SIMPLE
2.0000 g/h | INTRAVENOUS | Status: AC
  Administered 2017-10-06: 2 g/h via INTRAVENOUS
  Filled 2017-10-06: qty 500
  Filled 2017-10-06: qty 40

## 2017-10-06 MED ORDER — WITCH HAZEL-GLYCERIN EX PADS: 1.0000 "application " | MEDICATED_PAD | CUTANEOUS | Status: DC | PRN

## 2017-10-06 MED ORDER — ACETAMINOPHEN 325 MG PO TABS: 650.0000 mg | ORAL_TABLET | ORAL | Status: DC | PRN

## 2017-10-06 MED ORDER — DIBUCAINE 1 % RE OINT: 1.0000 "application " | TOPICAL_OINTMENT | RECTAL | Status: DC | PRN

## 2017-10-06 MED ORDER — SIMETHICONE 80 MG PO CHEW
80.0000 mg | CHEWABLE_TABLET | Freq: Three times a day (TID) | ORAL | Status: DC
  Administered 2017-10-06 – 2017-10-08 (×7): 80 mg via ORAL
  Filled 2017-10-06 (×7): qty 1

## 2017-10-06 MED ORDER — KETOROLAC TROMETHAMINE 30 MG/ML IJ SOLN
30.0000 mg | Freq: Four times a day (QID) | INTRAMUSCULAR | Status: AC
  Administered 2017-10-06 (×4): 30 mg via INTRAVENOUS
  Filled 2017-10-06 (×4): qty 1

## 2017-10-06 MED ORDER — OXYTOCIN 40 UNITS IN LACTATED RINGERS INFUSION - SIMPLE MED: 2.5000 [IU]/h | INTRAVENOUS | Status: AC

## 2017-10-06 MED ORDER — TETANUS-DIPHTH-ACELL PERTUSSIS 5-2.5-18.5 LF-MCG/0.5 IM SUSP: 0.5000 mL | Freq: Once | INTRAMUSCULAR | Status: DC

## 2017-10-06 MED ORDER — MENTHOL 3 MG MT LOZG: 1.0000 | LOZENGE | OROMUCOSAL | Status: DC | PRN

## 2017-10-06 MED ORDER — MAGNESIUM SULFATE BOLUS VIA INFUSION
4.0000 g | Freq: Once | INTRAVENOUS | Status: AC
  Administered 2017-10-06: 4 g via INTRAVENOUS
  Filled 2017-10-06: qty 500

## 2017-10-06 MED ORDER — ENOXAPARIN SODIUM 40 MG/0.4ML ~~LOC~~ SOLN
40.0000 mg | SUBCUTANEOUS | Status: DC
  Administered 2017-10-07: 40 mg via SUBCUTANEOUS
  Filled 2017-10-06: qty 0.4

## 2017-10-06 MED ORDER — SIMETHICONE 80 MG PO CHEW
80.0000 mg | CHEWABLE_TABLET | ORAL | Status: DC
  Administered 2017-10-06 – 2017-10-07 (×2): 80 mg via ORAL
  Filled 2017-10-06 (×2): qty 1

## 2017-10-06 MED ORDER — OXYCODONE HCL 5 MG PO TABS: 5.0000 mg | ORAL_TABLET | ORAL | Status: DC | PRN

## 2017-10-06 MED ORDER — DIPHENHYDRAMINE HCL 25 MG PO CAPS: 25.0000 mg | ORAL_CAPSULE | Freq: Four times a day (QID) | ORAL | Status: DC | PRN

## 2017-10-06 MED ORDER — OXYCODONE HCL 5 MG PO TABS: 10.0000 mg | ORAL_TABLET | ORAL | Status: DC | PRN

## 2017-10-06 MED ORDER — LACTATED RINGERS IV SOLN
INTRAVENOUS | Status: DC
  Administered 2017-10-06 (×2): via INTRAVENOUS

## 2017-10-06 MED ORDER — SIMETHICONE 80 MG PO CHEW: 80.0000 mg | CHEWABLE_TABLET | ORAL | Status: DC | PRN

## 2017-10-06 MED ORDER — COCONUT OIL OIL: 1.0000 "application " | TOPICAL_OIL | Status: DC | PRN

## 2017-10-06 MED ORDER — ZOLPIDEM TARTRATE 5 MG PO TABS: 5.0000 mg | ORAL_TABLET | Freq: Every evening | ORAL | Status: DC | PRN

## 2017-10-06 NOTE — Lactation Note (Signed)
This note was copied from a baby's chart. Lactation Consultation Note  Patient Name: Boy Corrine Tillis [id number] Date: 10/06/2017 Reason for consult: Initial assessment;Primapara;1st time breastfeeding;NICU baby;Other (Comment);Late-preterm 34-36.6wks;Infant < 6lbs;Infant weight loss(baby LPT less than 5 pounds, transferred to NICU )  When visiting mom in her room the 1st time , mom sleeping, 2nd visit awake.  Baby is 18 hours old and was transferred to NICU due to unstable temp and increased respiration.  LC reviewed supply and demand and the importance of being consistent with pumping both breast for 15 -20 mins , save milk. LC reassured mom it can be a slow process and if the pumping is consistent the milk will let down.  DEBP in the room and per mom has pumped and hasn't got'en  anything yet. LC mentioned to mom to use the yellow  Labels and save any milk she gets so it can be taken to NICU.  Mother informed of post-discharge support and given phone number to the lactation department, including services for phone call assistance; out-patient appointments; and breastfeeding support group. List of other breastfeeding resources in the community given in the handout. Encouraged mother to call for problems or concerns related to breastfeeding.     Maternal Data Has patient been taught Hand Expression?: (LC unable to due to company in the room ) Does the patient have breastfeeding experience prior to this delivery?: No  Feeding    LATCH Score                   Interventions Interventions: Breast feeding basics reviewed;DEBP  Lactation Tools Discussed/Used Tools: Pump Breast pump type: Double-Electric Breast Pump   Consult Status Consult Status: Follow-up Date: 10/07/17 Follow-up type: In-patient    Matilde Sprang Marvel Sapp 10/06/2017, 4:25 PM

## 2017-10-06 NOTE — Progress Notes (Signed)
Patient with elevated BPs in the severe range s/p delivery. Low threshold to start MAg given mild preeclampsia before delivery and elevated BPs intrapartum. Will start Mag 4g/2g at this time.   Caryl Ada, DO OB Fellow [facility] for [facility], [facility]

## 2017-10-06 NOTE — Anesthesia Postprocedure Evaluation (Signed)
Anesthesia Post Note  Patient: Hailey Mckenzie  Procedure(s) Performed: CESAREAN SECTION (N/A Abdomen)     Patient location during evaluation: [facility] Anesthesia Type: Epidural Level of consciousness: awake and alert Pain management: pain level controlled Vital Signs Assessment: post-procedure vital signs reviewed and stable Respiratory status: spontaneous breathing Cardiovascular status: blood pressure returned to baseline Postop Assessment: no headache, no backache, spinal receding, patient able to bend at knees and adequate PO intake Anesthetic complications: no    Last Vitals:  Vitals:   10/06/17 0809 10/06/17 0900  BP: (!) 140/96 131/86  Pulse: 78 62  Resp: 18 16  Temp: (!) 36.3 C (!) 36.3 C  SpO2: 100% 100%    Last Pain:  Vitals:   10/06/17 0900  TempSrc: Oral  PainSc:    Pain Goal: Patients Stated Pain Goal: 3 (10/06/17 0100)               Kyrell Ruacho

## 2017-10-06 NOTE — Progress Notes (Signed)
Pt brought from PACU to 102 with elevated blood pressure which continued to rise. The MD was notified and transferred the pt to High [facility].

## 2017-10-06 NOTE — Addendum Note (Signed)
Addendum  created 10/06/17 1016 by Jhonnie Garner, CRNA   Sign clinical note

## 2017-10-06 NOTE — Progress Notes (Signed)
Subjective: Postpartum Day 1: Cesarean Delivery d/t NRFHT's and SPEC Pt reports feeling sleepy and tired. Denies HA, visual changes or epigastric apin. Pain controlled. Tolerating diet.    Objective: Vital signs in last 24 hours: Temp:  [97.7 F (36.5 C)-98.8 F (37.1 C)] 97.9 F (36.6 C) (05/26 0411) Pulse Rate:  [51-97] 66 (05/26 0651) Resp:  [15-20] 18 (05/26 0651) BP: (118-168)/(41-119) 133/85 (05/26 0651) SpO2:  [95 %-100 %] 98 % (05/26 0651)  Physical Exam:  General: alert Lochia: appropriate Uterine Fundus: firm Incision: healing well DVT Evaluation: No evidence of DVT seen on physical exam, SCD's in place  Recent Labs    10/06/17 0018 10/06/17 0509  HGB 11.1* 11.4*  HCT 33.3* 34.7*    Assessment/Plan: POD # 1 LTCS SPEC  BP stable. UOP adequate. Magnesium x 24 hours. Following BP's. SCD's and Lovenox for DVT's prevention. Continue with supportive care  Hermina Staggers 10/06/2017, 7:24 AM

## 2017-10-06 NOTE — Anesthesia Postprocedure Evaluation (Signed)
Anesthesia Post Note  Patient: Hailey Mckenzie  Procedure(s) Performed: CESAREAN SECTION (N/A Abdomen)     Patient location during evaluation: PACU Anesthesia Type: Epidural Level of consciousness: awake and alert and oriented Pain management: pain level controlled Vital Signs Assessment: post-procedure vital signs reviewed and stable Respiratory status: nonlabored ventilation, spontaneous breathing and respiratory function stable Cardiovascular status: blood pressure returned to baseline and stable Postop Assessment: no headache, no backache, epidural receding, patient able to bend at knees and no apparent nausea or vomiting Anesthetic complications: no     Pain Goal: Patients Stated Pain Goal: 3 (10/06/17 0100)               Taimur Fier A.

## 2017-10-07 MED ORDER — AMLODIPINE BESYLATE 5 MG PO TABS
5.0000 mg | ORAL_TABLET | Freq: Every day | ORAL | Status: DC
  Administered 2017-10-07 – 2017-10-08 (×2): 5 mg via ORAL
  Filled 2017-10-07 (×2): qty 1

## 2017-10-07 NOTE — Progress Notes (Signed)
Subjective: Postpartum Day 2: Cesarean Delivery Patient reports tolerating PO and no problems voiding.  She denies flatus. She feel like shes 'full of air.'  Reports no pain at all.  Magnesium stopped yesterday.  Objective: Vital signs in last 24 hours: Temp:  [97.4 F (36.3 C)-98.6 F (37 C)] 98.6 F (37 C) (05/27 0742) Pulse Rate:  [55-82] 64 (05/27 0742) Resp:  [16-20] 18 (05/27 0742) BP: (131-150)/(86-118) 146/118 (05/27 0742) SpO2:  [100 %] 100 % (05/27 0742)  Physical Exam:  General: alert and no distress Lochia: appropriate Lungs: CTA CV: RRR Abd: + BS; NT; ND Uterine Fundus: firm Incision: healing well, pressure dressing removed. Blood on honeycomb dressing. No active bleeding noted.  DVT Evaluation: No evidence of DVT seen on physical exam.  Recent Labs    10/06/17 0018 10/06/17 0509  HGB 11.1* 11.4*  HCT 33.3* 34.7*    Assessment/Plan: Status post Cesarean section. Doing well postoperatively. BP elevated.  Will begin Norvasc .  Pt requests abd binder.  May discharge in am after flatus and BP control   Continue current care.  Willodean Rosenthal 10/07/2017, 7:49 AM

## 2017-10-07 NOTE — Progress Notes (Signed)
Notified by security that pt and family members were seen outside smoking. Pt informed RN that she would be heading down to NICU.

## 2017-10-07 NOTE — Lactation Note (Signed)
This note was copied from a baby's chart. Lactation Consultation Note  Patient Name: Hailey Mckenzie Date: 10/07/2017 Reason for consult: Follow-up assessment;NICU baby;Primapara;1st time breastfeeding;Late-preterm 34-36.6wks;Infant < 6lbs  Baby 41 1/2 hours old - LC was called by the NICU RN to meet mom in NICU to fit her with NS for feeding assessment and baby due to feed. Baby awake and LC fitted mom with #20 NS ( mom comfortable And instilled donor milk into the top of the NS for appetizer sucked for a few times and swallows noted  From the appetizer and then baby latched with Nipple in his mouth and gazing up at his mom.  Baby released and assisted mom with PACE feeding baby, baby sluggish with bottle also.  Mom still feeding when [facility] consult ended.  LC instructed mom to that NS #20 needed to be washed after every use and curved tip syringe , left in the  Baby's basin in the basket.      Maternal Data Has patient been taught Hand Expression?: Yes(LC taught mom and she repeated technique )  Feeding Feeding Type: Donor Breast Milk Nipple Type: Slow - flow Length of feed: 15 min  LATCH Score Latch score of 7 with LC - see doc flow sheets   Interventions Interventions: Breast feeding basics reviewed  Lactation Tools Discussed/Used Tools: Pump Breast pump type: Double-Electric Breast Pump WIC Program: No Pump Review: Setup, frequency, and cleaning;Milk Storage   Consult Status Consult Status: Follow-up Date: 10/08/17 Follow-up type: In-patient    Hailey Mckenzie 10/07/2017, 2:37 PM

## 2017-10-08 ENCOUNTER — Encounter (HOSPITAL_COMMUNITY): Payer: Self-pay | Admitting: *Deleted

## 2017-10-08 MED ORDER — SIMETHICONE 80 MG PO CHEW: 80.0000 mg | CHEWABLE_TABLET | Freq: Four times a day (QID) | ORAL | 0 refills | Status: DC | PRN

## 2017-10-08 MED ORDER — OXYCODONE HCL 5 MG PO TABS: 5.0000 mg | ORAL_TABLET | Freq: Four times a day (QID) | ORAL | 0 refills | Status: AC | PRN

## 2017-10-08 MED ORDER — POLYETHYLENE GLYCOL 3350 17 G PO PACK: 17.0000 g | PACK | Freq: Every day | ORAL | 0 refills | Status: DC

## 2017-10-08 MED ORDER — AMLODIPINE BESYLATE 5 MG PO TABS: 5.0000 mg | ORAL_TABLET | Freq: Every day | ORAL | 0 refills | Status: DC

## 2017-10-08 MED ORDER — IBUPROFEN 600 MG PO TABS: 600.0000 mg | ORAL_TABLET | Freq: Four times a day (QID) | ORAL | 1 refills | Status: DC | PRN

## 2017-10-08 NOTE — Clinical Social Work Maternal (Signed)
CLINICAL SOCIAL WORK MATERNAL/CHILD NOTE  Patient Details  Name: Hailey Mckenzie MRN: [id number] Date of Birth: 05-04-1986  Date:  10/08/2017  Clinical Social Worker Initiating Note:  Laurey Arrow Date/Time: Initiated:  10/08/17/1110     Child's Name:  Hailey Mckenzie   Biological Parents:  Mother, Father   Need for Interpreter:  None   Reason for Referral:  Behavioral Health Concerns(hx of bipolar disorder)   Address:  [address] [place] [address]    Phone number:  [phone] (home)     Additional phone number:   Household Members/Support Persons (HM/SP):   Household Member/Support Person 1   HM/SP Name Relationship DOB or Age  HM/SP -1 Rehman Arif FOB 12/19/1981  HM/SP -2        HM/SP -3        HM/SP -4        HM/SP -5        HM/SP -6        HM/SP -7        HM/SP -8          Natural Supports (not living in the home):  Immediate Family, Parent, Friends   Medical illustrator Supports: None   Employment: Full-time   Type of Work: Shelly Flatten   Education:  [place] arranged:    Museum/gallery curator Resources:  Multimedia programmer   Other Resources:  (MOB plans to apply for Medicaid for infant. )   Cultural/Religious Considerations Which May Impact Care:  None Reported  Strengths:  Ability to meet basic needs , Home prepared for child , Understanding of illness   Psychotropic Medications:         Pediatrician:       Pediatrician List:   [place]      Pediatrician Fax Number:    Risk Factors/Current Problems:  Mental Health Concerns    Cognitive State:  Able to Concentrate , Alert , Linear Thinking , Goal Oriented , Insightful    Mood/Affect:  Bright , Interested , Happy , Comfortable , Relaxed    CSW Assessment: CSW met with MOB in room 305 to complete an assessment for MH hx and NICU admission.  When CSW  arrived, MOB was relaxing in the bed and FOB was on the couch watching TV.  CSW explained CSW's role and with MOB's permission, CSW asked FOB to step out of the room in order to meet with MOB in private. MOB was polite, easy to engage, and receptive to meeting with CSW.    CSW asked about MOB's thoughts and feeling regarding infant's NICU admission.  MOB reported feeling calm and having a understanding of the importance of infant being transferred to the NICU.  MOB denied having any feelings of sadness or frustration regarding MOB discharging today without infant.  MOB stated, "I know this is where he needs to be to get the medical help that he needs. I feel ok with leaving today and being able to visit with Nazier as often as I like." CSW validated and normalized MOB's thoughts and feelings. CSW reviewed NICU visitation policy and encouraged MOB to visit as often as she likes.   CSW also asked about MOB's MH hx.  MOB stated that MOB was dx with Bipolar disorder around 2009.  MOB shared that MOB initially experimented with different medications however, did not  like how the medications made MOB feel.  MOB communicated that MOB discontinued all medications and has since utilized natural interventions to help manage MOB's symptoms. MOB explained that MOB engages in self-care activities and often rely on her supports when symptoms presents.  CSW offered MOB outpatient counseling resources and MOB declined.  MOB shared an understanding and concerns  about being more susceptible to PPD symptoms due to MOB's MH hx.  MOB reported having a strong support team that consisted of FOB, MOB's immediate family and close friends.  CSW provided education regarding the baby blues period vs. perinatal mood disorders, discussed treatment and gave resources for mental health follow up if concerns arise.  CSW recommends self-evaluation during the postpartum time period using the New Mom Checklist from Postpartum Progress and  encouraged MOB to contact a medical professional if symptoms are noted at any time. CSW assessed for safety and MOB denied SI, HI, and DV.  MOB presented with insight and awareness and did not display any acute MH signs or symptoms.   MOB reported having all necessary items for infant and feeling prepared to parent. CSW thanked MOB for meeting with CSW.  CSW Plan/Description:  Psychosocial Support and Ongoing Assessment of Needs, Perinatal Mood and Anxiety Disorder (PMADs) Education, Other Patient/Family Education, Sudden Infant Death Syndrome (SIDS) Education, Other Information/Referral to [place], MSW, [employer] Social Work [phone]  Dimple Nanas, LCSW 10/08/2017, 12:28 PM

## 2017-10-08 NOTE — Discharge Instructions (Signed)
Preeclampsia and Eclampsia What are the signs or symptoms? The earliest signs of preeclampsia are:  High blood pressure.  Increased protein in your urine. Your health care provider will check for this at every visit before you give birth (prenatal visit).  Other symptoms that may develop as the condition gets worse include:  Severe headaches.  Sudden weight gain.  Swelling of the hands, face, legs, and feet.  Nausea and vomiting.  Vision problems, such as blurred or double vision.  Numbness in the face, arms, legs, and feet.  Urinating less than usual.  Dizziness.  Slurred speech.  Abdominal pain, especially upper abdominal pain.  Convulsions or seizures.    How is this treated? You and your health care provider will determine the treatment approach that is best for you. Treatment may include:  Having more frequent prenatal exams to check for signs of preeclampsia, if you have an increased risk for preeclampsia.  Follow these instructions at home: Eating and drinking  Do not use any products that contain nicotine or tobacco, such as cigarettes and e-cigarettes. If you need help quitting, ask your health care provider.  Do not use alcohol or drugs.  Avoid stress as much as possible. Rest and get plenty of sleep. General instructions  Take over-the-counter and prescription medicines only as told by your health care provider.  When lying down, lie on your side. This keeps pressure off of your baby.  When sitting or lying down, raise (elevate) your feet. Try putting some pillows underneath your lower legs.  Exercise regularly. Ask your health care provider what kinds of exercise are best for you.  Keep all follow-up and prenatal visits as told by your health care provider. This is important. Contact a health care provider if:  You gain more weight than expected.  You have headaches.  You have nausea or vomiting.  You have abdominal pain.  You feel dizzy  or light-headed. Get help right away if:  You develop sudden or severe swelling anywhere in your body. This usually happens in the legs.  You gain 5 lbs (2.3 kg) or more during one week.  You have severe: ? Abdominal pain. ? Headaches. ? Dizziness. ? Vision problems. ? Confusion. ? Nausea or vomiting.  You have a seizure.  You have trouble moving any part of your body.  You develop numbness in any part of your body.  You have trouble speaking.  You have any abnormal bleeding.  You pass out.  Cesarean Delivery, Care After Refer to this sheet in the next few weeks. These instructions provide you with information on caring for yourself after your procedure. Your health care provider may also give you specific instructions. Your treatment has been planned according to current medical practices, but problems sometimes occur. Call your health care provider if you have any problems or questions after you go home. HOME CARE INSTRUCTIONS  Only take over-the-counter or prescription medications as directed by your health care provider.  Do not drink alcohol, especially if you are breastfeeding or taking medication to relieve pain.  Do not  smoke tobacco.  Continue to use good perineal care. Good perineal care includes:  Wiping your perineum from front to back.  Keeping your perineum clean.  Check your surgical cut (incision) daily for increased redness, drainage, swelling, or separation of skin.  Shower and clean your incision gently with soap and water every day, by letting warm and soapy water run over the incision, and then pat it dry. If  your health care provider says it is okay, leave the incision uncovered. Use a bandage (dressing) if the incision is draining fluid or appears irritated. If the adhesive strips across the incision do not fall off within 7 days, carefully peel them off, after a shower.  Hug a pillow when coughing or sneezing until your incision is healed. This  helps to relieve pain.  Do not use tampons, douches or have sexual intercourse, until your health care provider says it is okay.  Wear a well-fitting bra that provides breast support.  Limit wearing support panties or control-top hose.  Drink enough fluids to keep your urine clear or pale yellow.  Eat high-fiber foods such as whole grain cereals and breads, brown rice, beans, and fresh fruits and vegetables every day. These foods may help prevent or relieve constipation.  Resume activities such as climbing stairs, driving, lifting, exercising, or traveling as directed by your health care provider.  Try to have someone help you with your household activities and your newborn for at least a few days after you leave the hospital.  Rest as much as possible. Try to rest or take a nap when your newborn is sleeping.  Increase your activities gradually.  Do not lift more than 15lbs until directed by a provider.  Keep all of your scheduled postpartum appointments. It is very important to keep your scheduled follow-up appointments. At these appointments, your health care provider will be checking to make sure that you are healing physically and emotionally. SEEK MEDICAL CARE IF:   You are passing large clots from your vagina. Save any clots to show your health care provider.  You have a foul smelling discharge from your vagina.  You have trouble urinating.  You are urinating frequently.  You have pain when you urinate.  You have a change in your bowel movements.  You have increasing redness, pain, or swelling near your incision.  You have pus draining from your incision.  Your incision is separating.  You have painful, hard, or reddened breasts.  You have a severe headache.  You have blurred vision or see spots.  You feel sad or depressed.  You have thoughts of hurting yourself or your newborn.  You have questions about your care, the care of your newborn, or  medications.  You are dizzy or light-headed.  You have a rash.  You have pain, redness, or swelling at the site of the removed intravenous access (IV) tube.  You have nausea or vomiting.  You stopped breastfeeding and have not had a menstrual period within 12 weeks of stopping.  You are not breastfeeding and have not had a menstrual period within 12 weeks of delivery.  You have a fever. SEEK IMMEDIATE MEDICAL CARE IF:  You have persistent pain.  You have chest pain.  You have shortness of breath.  You faint.  You have leg pain.  You have stomach pain.  Your vaginal bleeding saturates 2 or more sanitary pads in 1 hour. MAKE SURE YOU:   Understand these instructions.  Will watch your condition.  Will get help right away if you are not doing well or get worse. Document Released: 01/20/2002 Document Revised: 09/14/2013 Document Reviewed: 12/26/2011 [facility] Patient Information 2015 [place], [place]. This information is not intended to replace advice given to you by your health care provider. Make sure you discuss any questions you have with your health care provider.

## 2017-10-08 NOTE — Progress Notes (Signed)
Discharge teaching complete with pt. Pt understood all information and did not have any questions. Pt discharged home to family. 

## 2017-10-08 NOTE — Discharge Summary (Signed)
Physician Obstetric Discharge Summary  Patient ID: Hailey Mckenzie MRN: [id number] DOB/AGE: 32/12/1985 31 y.o.   Prenatal Clinic: [facility]  Date of Admission: 10/05/2017  Date of Discharge: 10/08/2017  Admitting Diagnosis: BPP 4/10, decreased FM at [redacted]w[redacted]d  Secondary Diagnosis: pre-eclampsia w/o severe features  Discharge Diagnosis: s/p pLTCS   Intrapartum Procedures: failed IOL due to non reassuring fetal heart tones  Date of Delivery: 10/05/2017  Delivered by: Jaynie Collins, MD  Mode of Delivery: pLTCS   Anesthesia: epidural  Postpartum procedures: None  Complications: none  Brief Hospital Course  (Cesarean Section): Hailey Mckenzie is a G1P0101 who underwent cesarean section on 10/05/2017 after pitocin IOL. Cervix at 5-6cm.  Patient had an uncomplicated surgery; for further details of this surgery, please refer to the operative note.   Patient had an uncomplicated postpartum course, and she was started on norvasc  po qday on POD#2. By time of discharge on POD#3, her pain was controlled on oral pain medications; she had appropriate lochia and was ambulating, voiding without difficulty, tolerating regular diet and passing flatus and had a BM.   Dressing removed and old blood so this and the steri strips which also had old blood were removed. Incision with no s/s of infection. Slight skin separation with inferior edge some somewhat of an "underbite". Benzoin and steri strips reapplied to better reapproximate the edges.   She was deemed stable for discharge to home.     Labs: CBC Latest Ref Rng & Units 10/06/2017 10/06/2017 10/05/2017  WBC 4.0 - 10.5 K/uL 27.8(H) 20.6(H) 16.8(H)  Hemoglobin 12.0 - 15.0 g/dL 11.4(L) 11.1(L) 11.6(L)  Hematocrit 36.0 - 46.0 % 34.7(L) 33.3(L) 35.3(L)  Platelets 150 - 400 K/uL 198 182 197    --/--/AB POS, AB POS Performed at [facility], [address] [place], [place] [address]  [phone])  Physical exam:   Current Vital  Signs 24h Vital Sign Ranges  T 99.3 F (37.4 C) Temp  Avg: 98.6 F (37 C)  Min: 98.1 F (36.7 C)  Max: 99.3 F (37.4 C)  BP (!) 149/93 BP  Min: 121/77  Max: 149/93  HR (!) 59 Pulse  Avg: 64.2  Min: 56  Max: 78  RR 18 Resp  Avg: 17.2  Min: 16  Max: 18  SaO2 100 % Room Air SpO2  Avg: 99.5 %  Min: 99 %  Max: 100 %       24 Hour I/O Current Shift I/O  Time Ins Outs No intake/output data recorded. No intake/output data recorded.    Patient Vitals for the past 6 hrs:  BP Temp Temp src Pulse Resp SpO2  10/08/17 0905 (!) 149/93 99.3 F (37.4 C) Oral (!) 59 18 100 %  10/08/17 0633 121/77 98.4 F (36.9 C) - (!) 56 16 100 %    NAD Perineum: deferred Abdomen: firm fundus below the umbilicus, NTTP, non distended, +bowel sounds.  Incision see above RRR no MRGs CTAB Ext: no c/c/e  Discharge Instructions: Per After Visit Summary. Activity: Advance as tolerated. Pelvic rest for 6 weeks.  Also refer to After Visit Summary Diet: Regular Postpartum contraception: desires Nexplanon in clinic Discharge Medications:  Allergies as of 10/08/2017   No Known Allergies     Medication List    STOP taking these medications   HAIR SKIN & NAILS ADVANCED PO     TAKE these medications   amLODipine 5 MG tablet Commonly known as:  NORVASC Take 1 tablet (5 mg total) by mouth daily.  ibuprofen 600 MG tablet Commonly known as:  ADVIL,MOTRIN Take 1 tablet (600 mg total) by mouth every 6 (six) hours as needed for mild pain or cramping.   oxyCODONE 5 MG immediate release tablet Commonly known as:  Oxy IR/ROXICODONE Take 1-2 tablets (5-10 mg total) by mouth every 6 (six) hours as needed for up to 7 days for severe pain (pain scale 4-7).   polyethylene glycol packet Commonly known as:  MIRALAX Take 17 g by mouth daily.   prenatal vitamin w/FE, FA 27-1 MG Tabs tablet Take 1 tablet by mouth daily at 12 noon.   simethicone 80 MG chewable tablet Commonly known as:  MYLICON Chew 1 tablet (80 mg  total) by mouth 4 (four) times daily as needed for flatulence.   Vitamin D (Ergocalciferol) 50000 units Caps capsule Commonly known as:  DRISDOL Take 1 capsule (50,000 Units total) by mouth every 7 (seven) days.       Discharged Condition: Stable Discharged to: Home Outpatient follow up:  Follow-up Information    [facility]. Go in 1 week(s).   Specialty:  Obstetrics and Gynecology Why:  blood pressure and incision check Contact information: [address] [place] [address] [phone]          Newborn Data: APGAR (1 MIN): 8   APGAR (5 MINS): 9   APGAR (10 MINS):   Baby Weight: 1995gm Baby Feeding: Breast Disposition:Nursery; possibly home with mom   Hailey Copa MD Attending [facility])

## 2017-10-09 ENCOUNTER — Telehealth: Payer: Self-pay

## 2017-10-09 ENCOUNTER — Encounter: Payer: 59 | Admitting: Obstetrics & Gynecology

## 2017-10-09 ENCOUNTER — Other Ambulatory Visit: Payer: Self-pay

## 2017-10-09 DIAGNOSIS — Z7189 Other specified counseling: Secondary | ICD-10-CM

## 2017-10-09 MED ORDER — MISC. DEVICES KIT: 1.0000 | PACK | Freq: Every day | 0 refills | Status: DC

## 2017-10-09 MED ORDER — BREAST PUMP MISC: 1.0000 | 0 refills | Status: DC | PRN

## 2017-10-09 NOTE — Telephone Encounter (Signed)
Patient desires prescription for breast pump. Patient will come by office to pick up. Armandina Stammer RN

## 2017-10-15 ENCOUNTER — Ambulatory Visit (INDEPENDENT_AMBULATORY_CARE_PROVIDER_SITE_OTHER): Payer: 59 | Admitting: Advanced Practice Midwife

## 2017-10-15 ENCOUNTER — Encounter: Payer: Self-pay | Admitting: Advanced Practice Midwife

## 2017-10-15 VITALS — BP 128/88 | HR 76 | Ht 66.0 in | Wt 142.4 lb

## 2017-10-15 DIAGNOSIS — O133 Gestational [pregnancy-induced] hypertension without significant proteinuria, third trimester: Secondary | ICD-10-CM

## 2017-10-15 DIAGNOSIS — Z98891 History of uterine scar from previous surgery: Secondary | ICD-10-CM

## 2017-10-17 ENCOUNTER — Encounter: Payer: Self-pay | Admitting: Advanced Practice Midwife

## 2017-10-17 NOTE — Patient Instructions (Signed)

## 2017-10-17 NOTE — Progress Notes (Signed)
GYNECOLOGY CLINIC ANNUAL PREVENTATIVE CARE ENCOUNTER NOTE  Subjective:   Hailey Mckenzie is a 32 y.o. G50P0101 female here for Early Postoperative exam.  Current complaints: s/p Cesarean Section on 10/05/17.  Here for incision and BP check.  Has been doing well.  Baby is growing and feeding well (pumps breastmilk).   Denies headache or fever..    Obstetric History OB History  Gravida Para Term Preterm AB Living  _0 SAB TAB Ectopic Multiple Live Births        0 1    # Outcome Date GA Lbr Len/2nd Weight Sex Delivery Anes PTL Lv  1 Preterm 10/05/17 [redacted]w[redacted]d 4 lb 6.4 oz (1.995 kg) M CS-LTranv EPI  LIV    Past Medical History:  Diagnosis Date  . Anxiety   . Asthma   . Depression   . Pregnancy induced hypertension   . Thalassemia trait     Past Surgical History:  Procedure Laterality Date  . CESAREAN SECTION N/A 10/05/2017   Procedure: CESAREAN SECTION;  Surgeon: EChancy Milroy MD;  [facility]  Service: Obstetrics;  Laterality: N/A;  . WISDOM TOOTH EXTRACTION      Current Outpatient Medications on File Prior to Visit  Medication Sig Dispense Refill  . amLODipine (NORVASC) 5 MG tablet Take 1 tablet (5 mg total) by mouth daily. 30 tablet 0  . ibuprofen (ADVIL,MOTRIN) 600 MG tablet Take 1 tablet (600 mg total) by mouth every 6 (six) hours as needed for mild pain or cramping. 30 tablet 1  . prenatal vitamin w/FE, FA (PRENATAL 1 + 1) 27-1 MG TABS tablet Take 1 tablet by mouth daily at 12 noon.    . Vitamin D, Ergocalciferol, (DRISDOL) 50000 units CAPS capsule Take 1 capsule (50,000 Units total) by mouth every 7 (seven) days. 30 capsule 0  . Misc. Devices KIT 1 Device by Does not apply route daily. One hospital grade breastpump. (Patient not taking: Reported on 10/15/2017) 1 each 0  . polyethylene glycol (MIRALAX) packet Take 17 g by mouth daily. (Patient not taking: Reported on 10/15/2017) 14 each 0  . simethicone (MYLICON) 80 MG chewable tablet Chew 1 tablet  (80 mg total) by mouth 4 (four) times daily as needed for flatulence. (Patient not taking: Reported on 10/15/2017) 30 tablet 0   No current facility-administered medications on file prior to visit.     No Known Allergies  Social History   Socioeconomic History  . Marital status: Single    Spouse name: Not on file  . Number of children: Not on file  . Years of education: Not on file  . Highest education level: Not on file  Occupational History  . Not on file  Social Needs  . Financial resource strain: Not on file  . Food insecurity:    Worry: Not on file    Inability: Not on file  . Transportation needs:    Medical: Not on file    Non-medical: Not on file  Tobacco Use  . Smoking status: Never Smoker  . Smokeless tobacco: Never Used  Substance and Sexual Activity  . Alcohol use: Yes    Alcohol/week: 0.6 oz    Types: 1 Glasses of wine per week  . Drug use: No  . Sexual activity: Yes  Lifestyle  . Physical activity:    Days per week: Not on file    Minutes per session: Not on file  . Stress: Not on file  Relationships  . Social connections:    Talks on phone: Not on file    Gets together: Not on file    Attends religious service: Not on file    Active member of club or organization: Not on file    Attends meetings of clubs or organizations: Not on file    Relationship status: Not on file  . Intimate partner violence:    Fear of current or ex partner: Not on file    Emotionally abused: Not on file    Physically abused: Not on file    Forced sexual activity: Not on file  Other Topics Concern  . Not on file  Social History Narrative  . Not on file    Family History  Problem Relation Age of Onset  . Hypertension Mother   . Asthma Father   . Hypertension Maternal Grandmother   . Cancer Neg Hx   . Diabetes Neg Hx   . Stroke Neg Hx     The following portions of the patient's history were reviewed and updated as appropriate: allergies, current medications, past  family history, past medical history, past social history, past surgical history and problem list.  Review of Systems Pertinent items noted in HPI and remainder of comprehensive ROS otherwise negative.   Objective:  BP 128/88   Pulse 76   Ht _0  (1.676 m)   Wt 142 lb 6.4 oz (64.6 kg)   LMP 01/23/2017 (Exact Date)   BMI 22.98 kg/m    CONSTITUTIONAL: Well-developed, well-nourished female in no acute distress.  [place]: Alert and oriented to person, place, and time. Normal reflexes, muscle tone coordination. No cranial nerve deficit noted. PSYCHIATRIC: Normal mood and affect. Normal behavior. Normal judgment and thought content. CARDIOVASCULAR: Normal heart rate noted, regular rhythm RESPIRATORY: no problems with respiration noted. ABDOMEN: Soft, normal bowel sounds, no distention noted.  Incision with steristrips healing well  No drainage or erethema  PELVIC: Deferred  .   Assessment:  Postoperative cesarean delivery x 10 days Stable blood pressures Healing incision   Plan:  Continue plan of care at home May remove steristrips Report any headache or visual changes Followup with Postpartum visit

## 2017-10-23 ENCOUNTER — Encounter: Payer: 59 | Admitting: Obstetrics & Gynecology

## 2017-10-29 ENCOUNTER — Encounter: Payer: 59 | Admitting: Advanced Practice Midwife

## 2017-11-13 ENCOUNTER — Encounter: Payer: Self-pay | Admitting: Obstetrics & Gynecology

## 2017-11-13 ENCOUNTER — Ambulatory Visit (INDEPENDENT_AMBULATORY_CARE_PROVIDER_SITE_OTHER): Payer: 59 | Admitting: Obstetrics & Gynecology

## 2017-11-13 DIAGNOSIS — Z3046 Encounter for surveillance of implantable subdermal contraceptive: Secondary | ICD-10-CM | POA: Diagnosis not present

## 2017-11-13 DIAGNOSIS — Z1389 Encounter for screening for other disorder: Secondary | ICD-10-CM

## 2017-11-13 DIAGNOSIS — Z3202 Encounter for pregnancy test, result negative: Secondary | ICD-10-CM | POA: Diagnosis not present

## 2017-11-13 DIAGNOSIS — Z3009 Encounter for other general counseling and advice on contraception: Secondary | ICD-10-CM

## 2017-11-13 LAB — POCT URINE PREGNANCY: PREG TEST UR: NEGATIVE

## 2017-11-13 MED ORDER — ETONOGESTREL 68 MG ~~LOC~~ IMPL
68.0000 mg | DRUG_IMPLANT | Freq: Once | SUBCUTANEOUS | Status: AC
  Administered 2017-11-13: 68 mg via SUBCUTANEOUS

## 2017-11-13 NOTE — Patient Instructions (Signed)
Etonogestrel implant What is this medicine? ETONOGESTREL (et oh noe JES trel) is a contraceptive (birth control) device. It is used to prevent pregnancy. It can be used for up to 3 years. This medicine may be used for other purposes; ask your health care provider or pharmacist if you have questions. COMMON BRAND NAME(S): Implanon, Nexplanon What should I tell my health care provider before I take this medicine? They need to know if you have any of these conditions: -abnormal vaginal bleeding -blood vessel disease or blood clots -cancer of the breast, cervix, or liver -depression -diabetes -gallbladder disease -headaches -heart disease or recent heart attack -high blood pressure -high cholesterol -kidney disease -liver disease -renal disease -seizures -tobacco smoker -an unusual or allergic reaction to etonogestrel, other hormones, anesthetics or antiseptics, medicines, foods, dyes, or preservatives -pregnant or trying to get pregnant -breast-feeding How should I use this medicine? This device is inserted just under the skin on the inner side of your upper arm by a health care professional. Talk to your pediatrician regarding the use of this medicine in children. Special care may be needed. Overdosage: If you think you have taken too much of this medicine contact a poison control center or emergency room at once. NOTE: This medicine is only for you. Do not share this medicine with others. What if I miss a dose? This does not apply. What may interact with this medicine? Do not take this medicine with any of the following medications: -amprenavir -bosentan -fosamprenavir This medicine may also interact with the following medications: -barbiturate medicines for inducing sleep or treating seizures -certain medicines for fungal infections like ketoconazole and itraconazole -grapefruit juice -griseofulvin -medicines to treat seizures like carbamazepine, felbamate, oxcarbazepine,  phenytoin, topiramate -modafinil -phenylbutazone -rifampin -rufinamide -some medicines to treat HIV infection like atazanavir, indinavir, lopinavir, nelfinavir, tipranavir, ritonavir -St. John's wort This list may not describe all possible interactions. Give your health care provider a list of all the medicines, herbs, non-prescription drugs, or dietary supplements you use. Also tell them if you smoke, drink alcohol, or use illegal drugs. Some items may interact with your medicine. What should I watch for while using this medicine? This product does not protect you against HIV infection (AIDS) or other sexually transmitted diseases. You should be able to feel the implant by pressing your fingertips over the skin where it was inserted. Contact your doctor if you cannot feel the implant, and use a non-hormonal birth control method (such as condoms) until your doctor confirms that the implant is in place. If you feel that the implant may have broken or become bent while in your arm, contact your healthcare provider. What side effects may I notice from receiving this medicine? Side effects that you should report to your doctor or health care professional as soon as possible: -allergic reactions like skin rash, itching or hives, swelling of the face, lips, or tongue -breast lumps -changes in emotions or moods -depressed mood -heavy or prolonged menstrual bleeding -pain, irritation, swelling, or bruising at the insertion site -scar at site of insertion -signs of infection at the insertion site such as fever, and skin redness, pain or discharge -signs of pregnancy -signs and symptoms of a blood clot such as breathing problems; changes in vision; chest pain; severe, sudden headache; pain, swelling, warmth in the leg; trouble speaking; sudden numbness or weakness of the face, arm or leg -signs and symptoms of liver injury like dark yellow or brown urine; general ill feeling or flu-like symptoms;  light-colored   stools; loss of appetite; nausea; right upper belly pain; unusually weak or tired; yellowing of the eyes or skin -unusual vaginal bleeding, discharge -signs and symptoms of a stroke like changes in vision; confusion; trouble speaking or understanding; severe headaches; sudden numbness or weakness of the face, arm or leg; trouble walking; dizziness; loss of balance or coordination Side effects that usually do not require medical attention (report to your doctor or health care professional if they continue or are bothersome): -acne -back pain -breast pain -changes in weight -dizziness -general ill feeling or flu-like symptoms -headache -irregular menstrual bleeding -nausea -sore throat -vaginal irritation or inflammation This list may not describe all possible side effects. Call your doctor for medical advice about side effects. You may report side effects to FDA at 1-800-FDA-1088. Where should I keep my medicine? This drug is given in a hospital or clinic and will not be stored at home. NOTE: This sheet is a summary. It may not cover all possible information. If you have questions about this medicine, talk to your doctor, pharmacist, or health care provider.  2018 Elsevier/Gold Standard (2015-11-17 11:19:22) Nexplanon Instructions After Insertion   Keep bandage clean and dry for 24 hours   May use ice/Tylenol/Ibuprofen for soreness or pain   If you develop fever, drainage or increased warmth from incision site-contact office immediately   

## 2017-11-13 NOTE — Progress Notes (Signed)
..  Post Partum Exam  Hailey FosterDominique Mckenzie is a 32 y.o. 721P0101 female who presents for a postpartum visit. She is 5 weeks postpartum following a low cervical transverse Cesarean section. I have fully reviewed the prenatal and intrapartum course. The delivery was at 36 gestational weeks.  Anesthesia: epidural. Postpartum course has been unremarkable. Baby's course has been unremarkable. Baby is feeding by both breast and bottle - Similac Neosure. Bleeding no bleeding. Bowel function is normal. Bladder function is normal. Patient is not sexually active. Contraception method is Nexplanon. Postpartum depression screening:neg  The following portions of the patient's history were reviewed and updated as appropriate: allergies, current medications, past family history, past medical history, past social history, past surgical history and problem list.   Review of Systems Pertinent items are noted in HPI.    Objective:  Last menstrual period 01/23/2017, unknown if currently breastfeeding. BP 117/74 (BP Location: Left Arm, Patient Position: Sitting, Cuff Size: Large)   Pulse 98   Resp 18   Wt 140 lb 1.3 oz (63.5 kg)   LMP 11/03/2017 (Approximate)   Breastfeeding? No Comment: Pumping  BMI 22.61 kg/m   CONSTITUTIONAL: Well-developed, well-nourished female in no acute distress.  HENT:  Normocephalic, atraumatic EYES: Conjunctivae and EOM are normal. No scleral icterus.  NECK: Normal range of motion SKIN: Skin is warm and dry. No rash noted. Not diaphoretic.No pallor. NEUROLGIC: Alert and oriented to person, place, and time. Normal coordination.  Abd: soft, NT, ND incision: C/D/I   Patient given informed consent, she signed consent form. Pregnancy test was negative.  Appropriate time out taken.  Patient's left arm was prepped and draped in the usual sterile fashion.. The ruler used to measure and mark insertion area.  Patient was prepped with alcohol swab and then injected with 5 ml of 1 % lidocaine.  She  was prepped with betadine, Nexplanon removed from packaging,  Device confirmed in needle, then inserted full length of needle and withdrawn per handbook instructions.  There was minimal blood loss.  Patient insertion site covered with guaze and a pressure bandage to reduce any bruising.  The patient tolerated the procedure well and was given post procedure instructions.   Assessment:    5 week postpartum exam.  H/o elevated BP in pregnancy- BP now WNL Contraception counseling- pt desires Nexplanon  Plan:   1. Contraception: Nexplanon inserted today 2. Stop  Norvasc 3. Follow up in: 2 weeks for BP check or as needed.   Hailey Mckenzie, M.D., Evern CoreFACOG

## 2017-11-25 ENCOUNTER — Other Ambulatory Visit: Payer: 59

## 2017-11-27 ENCOUNTER — Other Ambulatory Visit: Payer: 59

## 2017-11-27 NOTE — Progress Notes (Addendum)
Patient returns to office for blood pressure check. She has now been off the amoldopine since beginning of July. Patient denies any headaches or dizziness.  Patient complaining of decrease milk supply since insertion of Nexplanon. Patient advised she could try something like lactation cookies or even fenugreek supplement.   Patient will return as needed. Armandina StammerJennifer Brandilynn Taormina RN  Attestation of Attending Supervision of RN: Evaluation and management procedures were performed by the nurse under my supervision and collaboration.  I have reviewed the nursing note and chart, and I agree with the management and plan.  Carolyn L. Harraway-Smith, M.D., Evern CoreFACOG

## 2018-07-03 ENCOUNTER — Ambulatory Visit: Payer: 59 | Admitting: Medical

## 2018-07-08 ENCOUNTER — Ambulatory Visit: Payer: 59 | Admitting: Medical

## 2018-07-29 ENCOUNTER — Encounter: Payer: Self-pay | Admitting: Medical

## 2018-07-29 ENCOUNTER — Ambulatory Visit (INDEPENDENT_AMBULATORY_CARE_PROVIDER_SITE_OTHER): Payer: 59 | Admitting: Medical

## 2018-07-29 ENCOUNTER — Ambulatory Visit: Payer: 59 | Admitting: Medical

## 2018-07-29 ENCOUNTER — Other Ambulatory Visit: Payer: Self-pay

## 2018-07-29 VITALS — BP 118/74 | HR 68 | Temp 98.1°F | Resp 16 | Ht 66.0 in | Wt 149.8 lb

## 2018-07-29 DIAGNOSIS — Z0000 Encounter for general adult medical examination without abnormal findings: Secondary | ICD-10-CM

## 2018-07-29 DIAGNOSIS — J4520 Mild intermittent asthma, uncomplicated: Secondary | ICD-10-CM

## 2018-07-29 MED ORDER — ALBUTEROL SULFATE HFA 108 (90 BASE) MCG/ACT IN AERS: 2.0000 | INHALATION_SPRAY | Freq: Four times a day (QID) | RESPIRATORY_TRACT | 2 refills | Status: AC | PRN

## 2018-07-29 NOTE — Progress Notes (Signed)
Subjective:    Patient ID: Hailey Mckenzie, female    DOB: 01/01/1986, 33 y.o.   MRN: [id number]  HPI  Pt in for first time.   Mom of 10 month boy. Pt works [employer]. No exercise regularly. Pt eating health. Eats very little meat. Eats plenty fruits and vegetables. Non smoker. Alcohol use moderate on weekends.  No medications on regular basis. She takes b12 complex daily.and supplament increase breast mild production.(pt plans to breast feed. No end date exactly. She will try to go 18 months)  Pt declines flu vaccine.  Pt states she has been out of work for 2 weeks. She had asthma attack and some GI symptoms in ED. She got over those quickly but her work now has 14 day off work period. Pt was symptomatic for about one day but work told her she needs to be off work. She needs return note.    Review of Systems  Constitutional: Negative for chills, fatigue and fever.  HENT: Negative for congestion, dental problem, ear pain, hearing loss, sinus pain, sneezing, sore throat and trouble swallowing.   Respiratory: Negative for cough, chest tightness, shortness of breath and wheezing.        Last wheezed in 2016. Apart from recent exercise type induced event 2 weeks ago.  Cardiovascular: Negative for chest pain and palpitations.  Gastrointestinal: Negative for abdominal pain.  Genitourinary: Negative for difficulty urinating, dysuria, frequency, hematuria, urgency and vaginal discharge.  Musculoskeletal: Negative for back pain, joint swelling, myalgias and neck pain.  Skin: Negative for rash.  Neurological: Negative for dizziness, syncope, speech difficulty, weakness and headaches.  Hematological: Negative for adenopathy. Does not bruise/bleed easily.  Psychiatric/Behavioral: Negative for behavioral problems, confusion and hallucinations. The patient is not nervous/anxious.     Past Medical History:  Diagnosis Date  . Anxiety   . Asthma   . Depression   . Pregnancy induced  hypertension   . Thalassemia trait      Social History   Socioeconomic History  . Marital status: Significant Other    Spouse name: Not on file  . Number of children: Not on file  . Years of education: Not on file  . Highest education level: Not on file  Occupational History  . Not on file  Social Needs  . Financial resource strain: Not on file  . Food insecurity:    Worry: Not on file    Inability: Not on file  . Transportation needs:    Medical: Not on file    Non-medical: Not on file  Tobacco Use  . Smoking status: Never Smoker  . Smokeless tobacco: Never Used  Substance and Sexual Activity  . Alcohol use: Yes    Alcohol/week: 1.0 standard drinks    Types: 1 Glasses of wine per week  . Drug use: No  . Sexual activity: Yes  Lifestyle  . Physical activity:    Days per week: Not on file    Minutes per session: Not on file  . Stress: Not on file  Relationships  . Social connections:    Talks on phone: Not on file    Gets together: Not on file    Attends religious service: Not on file    Active member of club or organization: Not on file    Attends meetings of clubs or organizations: Not on file    Relationship status: Not on file  . Intimate partner violence:    Fear of current or ex partner: Not on  file    Emotionally abused: Not on file    Physically abused: Not on file    Forced sexual activity: Not on file  Other Topics Concern  . Not on file  Social History Narrative  . Not on file    Past Surgical History:  Procedure Laterality Date  . CESAREAN SECTION N/A 10/05/2017   Procedure: CESAREAN SECTION;  Surgeon: Hermina Staggers, MD;  Location: [facility]ING SU[facility];  Service: Obstetrics;  Laterality: N/A;  . WISDOM TOOTH EXTRACTION      Family History  Problem Relation Age of Onset  . Hypertension Mother   . Asthma Father   . Hypertension Maternal Grandmother   . Cancer Neg Hx   . Diabetes Neg Hx   . Stroke Neg Hx     No Known Allergies  Current  Outpatient Medications on File Prior to Visit  Medication Sig Dispense Refill  . prenatal vitamin w/FE, FA (PRENATAL 1 + 1) 27-1 MG TABS tablet Take 1 tablet by mouth daily at 12 noon.     No current facility-administered medications on file prior to visit.     BP 118/74   Pulse 68   Temp 98.1 F (36.7 C) (Oral)   Resp 16   Ht 5\' 6"  (1.676 m)   Wt 149 lb 12.8 oz (67.9 kg)   SpO2 100%   BMI 24.18 kg/m       Objective:   Physical Exam  General Mental Status- Alert. General Appearance- Not in acute distress.   Skin General: Color- Normal Color. Moisture- Normal Moisture.  Neck Carotid Arteries- Normal color. Moisture- Normal Moisture. No carotid bruits. No JVD.  Chest and Lung Exam Auscultation: Breath Sounds:-Normal.  Cardiovascular Auscultation:Rythm- Regular. Murmurs & Other Heart Sounds:Auscultation of the heart reveals- No Murmurs.  Abdomen Inspection:-Inspeection Normal. Palpation/Percussion:Note:No mass. Palpation and Percussion of the abdomen reveal- Non Tender, Non Distended + BS, no rebound or guarding.  Neurologic Cranial Nerve exam:- CN III-XII intact(No nystagmus), symmetric smile. Strength:- 5/5 equal and symmetric strength both upper and lower extremities.      Assessment & Plan:  For you wellness exam today I have ordered cbc, cmp, and  lipid panel.(future order to be done fasting)  Flu vaccine declined. Can get done next fall.  Recommend exercise and healthy diet.  We will let you know lab results as they come in.  Follow up date appointment will be determined after lab review.   Work note can return. Appears you had brief self limiting illness for one day only.   Making albuterol inhaler available for you in event of recurrent asthma flare.

## 2018-07-29 NOTE — Patient Instructions (Addendum)
For you wellness exam today I have ordered cbc, cmp, and  lipid panel.(future order to be done fasting)  Flu vaccine declined. Can get done next fall.  Recommend exercise and healthy diet.  We will let you know lab results as they come in.  Follow up date appointment will be determined after lab review.   Work note can return. Appears you had brief self limiting illness for one day only.   Making albuterol inhaler available for you in event of recurrent asthma flare.   Preventive Care 18-39 Years, Female Preventive care refers to lifestyle choices and visits with your health care provider that can promote health and wellness. What does preventive care include?   A yearly physical exam. This is also called an annual well check.  Dental exams once or twice a year.  Routine eye exams. Ask your health care provider how often you should have your eyes checked.  Personal lifestyle choices, including: ? Daily care of your teeth and gums. ? Regular physical activity. ? Eating a healthy diet. ? Avoiding tobacco and drug use. ? Limiting alcohol use. ? Practicing safe sex. ? Taking vitamin and mineral supplements as recommended by your health care provider. What happens during an annual well check? The services and screenings done by your health care provider during your annual well check will depend on your age, overall health, lifestyle risk factors, and family history of disease. Counseling Your health care provider may ask you questions about your:  Alcohol use.  Tobacco use.  Drug use.  Emotional well-being.  Home and relationship well-being.  Sexual activity.  Eating habits.  Work and work Statistician.  Method of birth control.  Menstrual cycle.  Pregnancy history. Screening You may have the following tests or measurements:  Height, weight, and BMI.  Diabetes screening. This is done by checking your blood sugar (glucose) after you have not eaten for a while  (fasting).  Blood pressure.  Lipid and cholesterol levels. These may be checked every 5 years starting at age 62.  Skin check.  Hepatitis C blood test.  Hepatitis B blood test.  Sexually transmitted disease (STD) testing.  BRCA-related cancer screening. This may be done if you have a family history of breast, ovarian, tubal, or peritoneal cancers.  Pelvic exam and Pap test. This may be done every 3 years starting at age 61. Starting at age 51, this may be done every 5 years if you have a Pap test in combination with an HPV test. Discuss your test results, treatment options, and if necessary, the need for more tests with your health care provider. Vaccines Your health care provider may recommend certain vaccines, such as:  Influenza vaccine. This is recommended every year.  Tetanus, diphtheria, and acellular pertussis (Tdap, Td) vaccine. You may need a Td booster every 10 years.  Varicella vaccine. You may need this if you have not been vaccinated.  HPV vaccine. If you are 72 or younger, you may need three doses over 6 months.  Measles, mumps, and rubella (MMR) vaccine. You may need at least one dose of MMR. You may also need a second dose.  Pneumococcal 13-valent conjugate (PCV13) vaccine. You may need this if you have certain conditions and were not previously vaccinated.  Pneumococcal polysaccharide (PPSV23) vaccine. You may need one or two doses if you smoke cigarettes or if you have certain conditions.  Meningococcal vaccine. One dose is recommended if you are age 78-21 years and a Market researcher living in  a residence hall, or if you have one of several medical conditions. You may also need additional booster doses.  Hepatitis A vaccine. You may need this if you have certain conditions or if you travel or work in places where you may be exposed to hepatitis A.  Hepatitis B vaccine. You may need this if you have certain conditions or if you travel or work in  places where you may be exposed to hepatitis B.  Haemophilus influenzae type b (Hib) vaccine. You may need this if you have certain risk factors. Talk to your health care provider about which screenings and vaccines you need and how often you need them. This information is not intended to replace advice given to you by your health care provider. Make sure you discuss any questions you have with your health care provider. Document Released: 06/26/2001 Document Revised: 12/11/2016 Document Reviewed: 03/01/2015 Elsevier Interactive Patient Education  2019 [employer].

## 2018-07-31 ENCOUNTER — Other Ambulatory Visit: Payer: 59

## 2019-03-05 ENCOUNTER — Telehealth: Payer: Self-pay

## 2019-03-05 NOTE — Telephone Encounter (Signed)
Pt called the office stating she has had the Nexplanon for 1 year and has never had a period until now. Pt states that she has been bleeding for seventeen days and the bleeding changes  from heavy to light some days. Pt is scheduled to come in to be seen on 03/09/19. Understanding was voiced. Colleena Kurtenbach l Cherly Erno, CMA

## 2019-03-09 ENCOUNTER — Ambulatory Visit (INDEPENDENT_AMBULATORY_CARE_PROVIDER_SITE_OTHER): Payer: 59 | Admitting: Obstetrics & Gynecology

## 2019-03-09 ENCOUNTER — Other Ambulatory Visit: Payer: Self-pay

## 2019-03-09 ENCOUNTER — Encounter: Payer: Self-pay | Admitting: Obstetrics & Gynecology

## 2019-03-09 VITALS — BP 110/76 | HR 63 | Ht 66.0 in | Wt 150.0 lb

## 2019-03-09 DIAGNOSIS — Z01419 Encounter for gynecological examination (general) (routine) without abnormal findings: Secondary | ICD-10-CM

## 2019-03-09 DIAGNOSIS — Z124 Encounter for screening for malignant neoplasm of cervix: Secondary | ICD-10-CM

## 2019-03-09 DIAGNOSIS — Z1151 Encounter for screening for human papillomavirus (HPV): Secondary | ICD-10-CM | POA: Diagnosis not present

## 2019-03-09 DIAGNOSIS — Z113 Encounter for screening for infections with a predominantly sexual mode of transmission: Secondary | ICD-10-CM | POA: Diagnosis not present

## 2019-03-09 DIAGNOSIS — N921 Excessive and frequent menstruation with irregular cycle: Secondary | ICD-10-CM

## 2019-03-09 MED ORDER — NORETHIN ACE-ETH ESTRAD-FE 1-20 MG-MCG(24) PO TABS: 1.0000 | ORAL_TABLET | Freq: Every day | ORAL | 2 refills | Status: DC

## 2019-03-09 NOTE — Progress Notes (Signed)
Subjective:     Hailey Mckenzie is a 33 y.o. female here for a routine exam. G1P1001 Current complaints: Pt reports amenorrhea with Nexplanon for 2 years but, this month, she had daily bleeding for >20 days. She stoppped 2 days prev. She denies new sexual partner and she denies h/o STIs.  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       Gynecologic History Patient's last menstrual period was 02/17/2019. Contraception: Nexplanon Last Pap: 2016 Results were: normal Last mammogram: n/a.   Obstetric History OB History  Gravida Para Term Preterm AB Living  1 1   1   1   SAB TAB Ectopic Multiple Live Births        0 1    # Outcome Date GA Lbr Len/2nd Weight Sex Delivery Anes PTL Lv  1 Preterm 10/05/17 [redacted]w[redacted]d  4 lb 6.4 oz (1.995 kg) M CS-LTranv EPI  LIV   The following portions of the patient's history were reviewed and updated as appropriate: allergies, current medications, past family history, past medical history, past social history, past surgical history and problem list.  Review of Systems Pertinent items are noted in HPI.    Objective:  BP 110/76   Pulse 63   Ht 5\' 6"  (1.676 m)   Wt 150 lb (68 kg)   LMP 02/17/2019   Breastfeeding Yes   BMI 24.21 kg/m  General Appearance:    Alert, cooperative, no distress, appears stated age  Head:    Normocephalic, without obvious abnormality, atraumatic  Eyes:    conjunctiva/corneas clear, EOM's intact, both eyes  Ears:    Normal external ear canals, both ears  Nose:   Nares normal, septum midline, mucosa normal, no drainage    or sinus tenderness  Throat:   Lips, mucosa, and tongue normal; teeth and gums normal  Neck:   Supple, symmetrical, trachea midline, no adenopathy;    thyroid:  no enlargement/tenderness/nodules  Back:     Symmetric, no curvature, ROM normal, no CVA tenderness  Lungs:     respirations unlabored  Chest Wall:    No tenderness or deformity   Heart:    Regular rate and rhythm  Breast Exam:    No  tenderness, masses, or nipple abnormality  Abdomen:     Soft, non-tender, bowel sounds active all four quadrants,    no masses, no organomegaly  Genitalia:    Normal female without lesion, discharge or tenderness; no      Extremities:   Extremities normal, atraumatic, no cyanosis or edema  Pulses:   2+ and symmetric all extremities  Skin:   Skin color, texture, turgor normal, no rashes or lesions     Assessment:    Healthy female exam.   Breakthrough bleeding with Nexplanon.    Plan:   F/u PAP and cx LoEstrin 1/20 1 po q day x 3 months. Pt will only take it if the irreg bleeding returns.  F/u in 1 year or sooner prn  Mauria Asquith L. Harraway-Smith, M.D., Cherlynn June

## 2019-03-09 NOTE — Patient Instructions (Signed)
Etonogestrel implant What is this medicine? ETONOGESTREL (et oh noe JES trel) is a contraceptive (birth control) device. It is used to prevent pregnancy. It can be used for up to 3 years. This medicine may be used for other purposes; ask your health care provider or pharmacist if you have questions. COMMON BRAND NAME(S): Implanon, Nexplanon What should I tell my health care provider before I take this medicine? They need to know if you have any of these conditions:  abnormal vaginal bleeding  blood vessel disease or blood clots  breast, cervical, endometrial, ovarian, liver, or uterine cancer  diabetes  gallbladder disease  heart disease or recent heart attack  high blood pressure  high cholesterol or triglycerides  kidney disease  liver disease  migraine headaches  seizures  stroke  tobacco smoker  an unusual or allergic reaction to etonogestrel, anesthetics or antiseptics, other medicines, foods, dyes, or preservatives  pregnant or trying to get pregnant  breast-feeding How should I use this medicine? This device is inserted just under the skin on the inner side of your upper arm by a health care professional. Talk to your pediatrician regarding the use of this medicine in children. Special care may be needed. Overdosage: If you think you have taken too much of this medicine contact a poison control center or emergency room at once. NOTE: This medicine is only for you. Do not share this medicine with others. What if I miss a dose? This does not apply. What may interact with this medicine? Do not take this medicine with any of the following medications:  amprenavir  fosamprenavir This medicine may also interact with the following medications:  acitretin  aprepitant  armodafinil  bexarotene  bosentan  carbamazepine  certain medicines for fungal infections like fluconazole, ketoconazole, itraconazole and voriconazole  certain medicines to treat  hepatitis, HIV or AIDS  cyclosporine  felbamate  griseofulvin  lamotrigine  modafinil  oxcarbazepine  phenobarbital  phenytoin  primidone  rifabutin  rifampin  rifapentine  St. John's wort  topiramate This list may not describe all possible interactions. Give your health care provider a list of all the medicines, herbs, non-prescription drugs, or dietary supplements you use. Also tell them if you smoke, drink alcohol, or use illegal drugs. Some items may interact with your medicine. What should I watch for while using this medicine? This product does not protect you against HIV infection (AIDS) or other sexually transmitted diseases. You should be able to feel the implant by pressing your fingertips over the skin where it was inserted. Contact your doctor if you cannot feel the implant, and use a non-hormonal birth control method (such as condoms) until your doctor confirms that the implant is in place. Contact your doctor if you think that the implant may have broken or become bent while in your arm. You will receive a user card from your health care provider after the implant is inserted. The card is a record of the location of the implant in your upper arm and when it should be removed. Keep this card with your health records. What side effects may I notice from receiving this medicine? Side effects that you should report to your doctor or health care professional as soon as possible:  allergic reactions like skin rash, itching or hives, swelling of the face, lips, or tongue  breast lumps, breast tissue changes, or discharge  breathing problems  changes in emotions or moods  if you feel that the implant may have broken or   bent while in your arm  high blood pressure  pain, irritation, swelling, or bruising at the insertion site  scar at site of insertion  signs of infection at the insertion site such as fever, and skin redness, pain or discharge  signs and  symptoms of a blood clot such as breathing problems; changes in vision; chest pain; severe, sudden headache; pain, swelling, warmth in the leg; trouble speaking; sudden numbness or weakness of the face, arm or leg  signs and symptoms of liver injury like dark yellow or brown urine; general ill feeling or flu-like symptoms; light-colored stools; loss of appetite; nausea; right upper belly pain; unusually weak or tired; yellowing of the eyes or skin  unusual vaginal bleeding, discharge Side effects that usually do not require medical attention (report to your doctor or health care professional if they continue or are bothersome):  acne  breast pain or tenderness  headache  irregular menstrual bleeding  nausea This list may not describe all possible side effects. Call your doctor for medical advice about side effects. You may report side effects to FDA at 1-800-FDA-1088. Where should I keep my medicine? This drug is given in a hospital or clinic and will not be stored at home. NOTE: This sheet is a summary. It may not cover all possible information. If you have questions about this medicine, talk to your doctor, pharmacist, or health care provider.  2020 Elsevier/Gold Standard (2017-03-19 14:11:42) Levonorgestrel intrauterine device (IUD) What is this medicine? LEVONORGESTREL IUD (LEE voe nor jes trel) is a contraceptive (birth control) device. The device is placed inside the uterus by a healthcare professional. It is used to prevent pregnancy. This device can also be used to treat heavy bleeding that occurs during your period. This medicine may be used for other purposes; ask your health care provider or pharmacist if you have questions. COMMON BRAND NAME(S): Kyleena, LILETTA, Mirena, Skyla What should I tell my health care provider before I take this medicine? They need to know if you have any of these conditions:  abnormal Pap smear  cancer of the breast, uterus, or  cervix  diabetes  endometritis  genital or pelvic infection now or in the past  have more than one sexual partner or your partner has more than one partner  heart disease  history of an ectopic or tubal pregnancy  immune system problems  IUD in place  liver disease or tumor  problems with blood clots or take blood-thinners  seizures  use intravenous drugs  uterus of unusual shape  vaginal bleeding that has not been explained  an unusual or allergic reaction to levonorgestrel, other hormones, silicone, or polyethylene, medicines, foods, dyes, or preservatives  pregnant or trying to get pregnant  breast-feeding How should I use this medicine? This device is placed inside the uterus by a health care professional. Talk to your pediatrician regarding the use of this medicine in children. Special care may be needed. Overdosage: If you think you have taken too much of this medicine contact a poison control center or emergency room at once. NOTE: This medicine is only for you. Do not share this medicine with others. What if I miss a dose? This does not apply. Depending on the brand of device you have inserted, the device will need to be replaced every 3 to 6 years if you wish to continue using this type of birth control. What may interact with this medicine? Do not take this medicine with any of the following medications:    amprenavir  bosentan  fosamprenavir This medicine may also interact with the following medications:  aprepitant  armodafinil  barbiturate medicines for inducing sleep or treating seizures  bexarotene  boceprevir  griseofulvin  medicines to treat seizures like carbamazepine, ethotoin, felbamate, oxcarbazepine, phenytoin, topiramate  modafinil  pioglitazone  rifabutin  rifampin  rifapentine  some medicines to treat HIV infection like atazanavir, efavirenz, indinavir, lopinavir, nelfinavir, tipranavir, ritonavir  St. John's  wort  warfarin This list may not describe all possible interactions. Give your health care provider a list of all the medicines, herbs, non-prescription drugs, or dietary supplements you use. Also tell them if you smoke, drink alcohol, or use illegal drugs. Some items may interact with your medicine. What should I watch for while using this medicine? Visit your doctor or health care professional for regular check ups. See your doctor if you or your partner has sexual contact with others, becomes HIV positive, or gets a sexual transmitted disease. This product does not protect you against HIV infection (AIDS) or other sexually transmitted diseases. You can check the placement of the IUD yourself by reaching up to the top of your vagina with clean fingers to feel the threads. Do not pull on the threads. It is a good habit to check placement after each menstrual period. Call your doctor right away if you feel more of the IUD than just the threads or if you cannot feel the threads at all. The IUD may come out by itself. You may become pregnant if the device comes out. If you notice that the IUD has come out use a backup birth control method like condoms and call your health care provider. Using tampons will not change the position of the IUD and are okay to use during your period. This IUD can be safely scanned with magnetic resonance imaging (MRI) only under specific conditions. Before you have an MRI, tell your healthcare provider that you have an IUD in place, and which type of IUD you have in place. What side effects may I notice from receiving this medicine? Side effects that you should report to your doctor or health care professional as soon as possible:  allergic reactions like skin rash, itching or hives, swelling of the face, lips, or tongue  fever, flu-like symptoms  genital sores  high blood pressure  no menstrual period for 6 weeks during use  pain, swelling, warmth in the  leg  pelvic pain or tenderness  severe or sudden headache  signs of pregnancy  stomach cramping  sudden shortness of breath  trouble with balance, talking, or walking  unusual vaginal bleeding, discharge  yellowing of the eyes or skin Side effects that usually do not require medical attention (report to your doctor or health care professional if they continue or are bothersome):  acne  breast pain  change in sex drive or performance  changes in weight  cramping, dizziness, or faintness while the device is being inserted  headache  irregular menstrual bleeding within first 3 to 6 months of use  nausea This list may not describe all possible side effects. Call your doctor for medical advice about side effects. You may report side effects to FDA at 1-800-FDA-1088. Where should I keep my medicine? This does not apply. NOTE: This sheet is a summary. It may not cover all possible information. If you have questions about this medicine, talk to your doctor, pharmacist, or health care provider.  2020 Elsevier/Gold Standard (2018-03-11 13:22:01)  

## 2019-03-09 NOTE — Progress Notes (Signed)
Pt states that she started bleeding on Oct. 6 and stopped bleeding on Oct. 23rd.

## 2019-03-11 LAB — CYTOLOGY - PAP
Chlamydia: NEGATIVE
Comment: NEGATIVE
Comment: NEGATIVE
Comment: NORMAL
Diagnosis: NEGATIVE
High risk HPV: NEGATIVE
Neisseria Gonorrhea: NEGATIVE

## 2019-03-15 IMAGING — US US MFM OB COMP +14 WKS
1 series · 14 of 28 positions shown · non-contrast
Comparison: none

[Series 1: us mfm ob comp +14 wks · 14 of 100 slices shown]
[im 4/100]
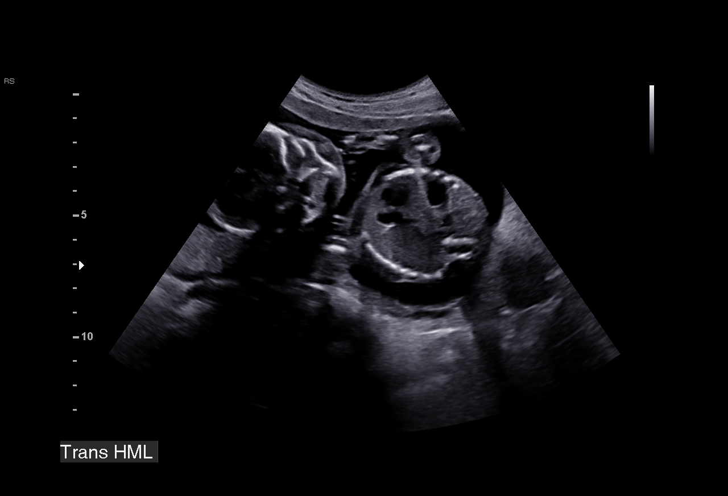
[im 12/100]
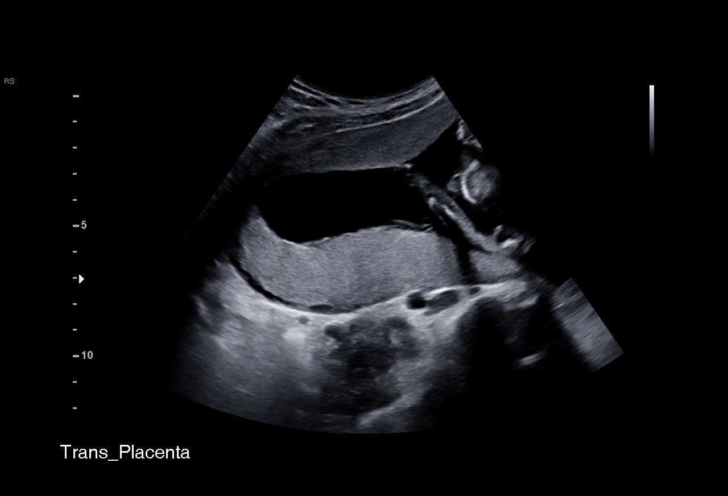
[im 19/100]
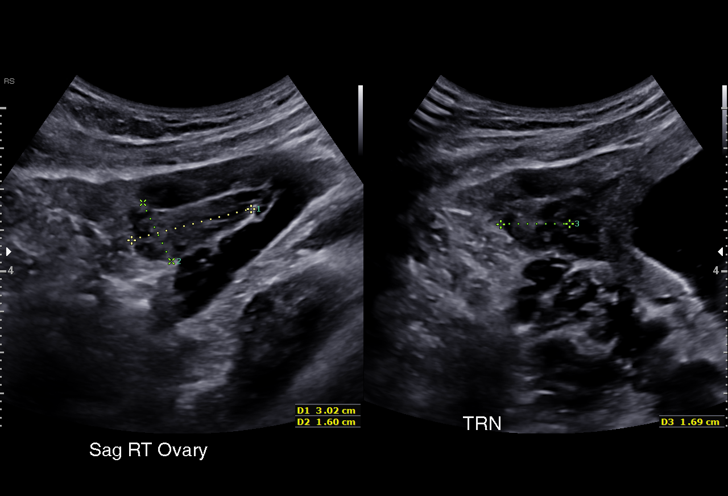
[im 26/100]
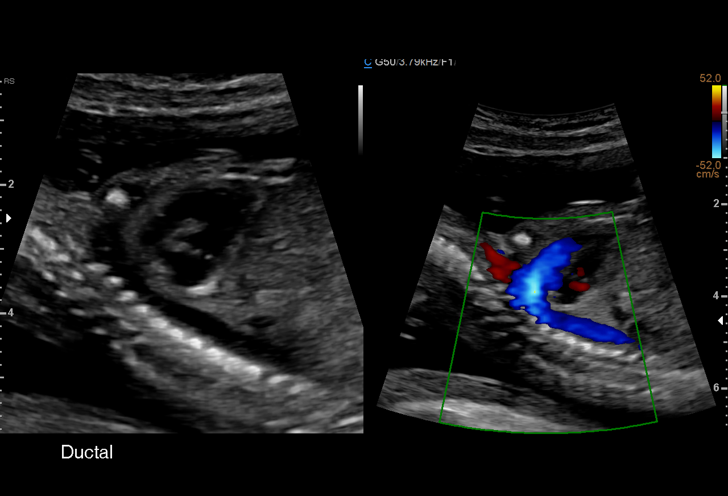
[im 34/100]
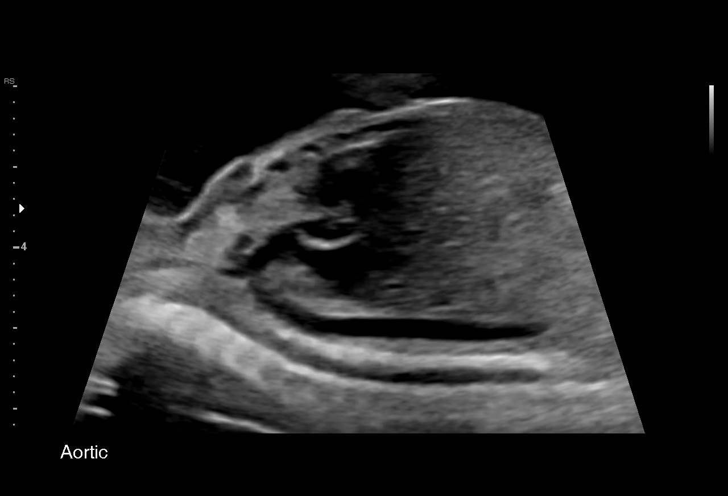
[im 41/100]
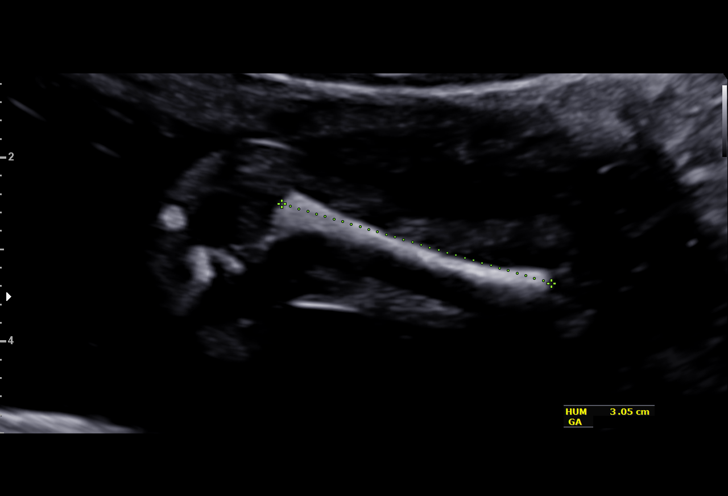
[im 48/100]
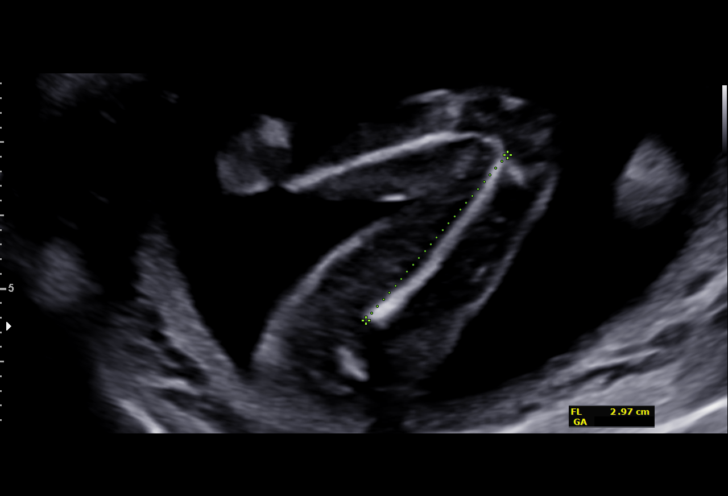
[im 56/100]
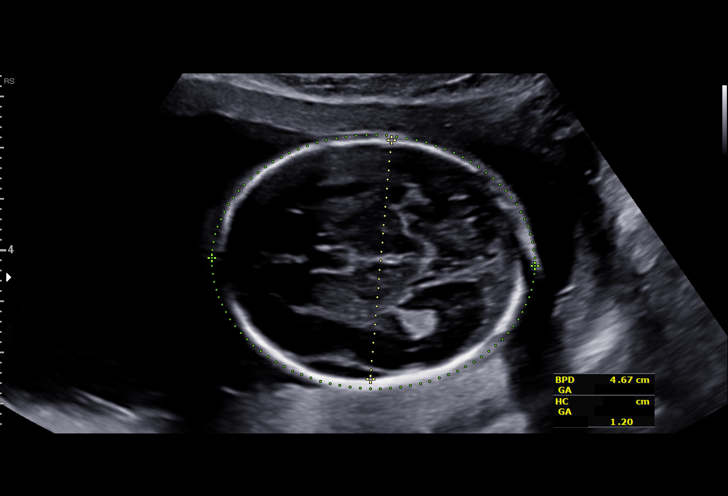
[im 63/100]
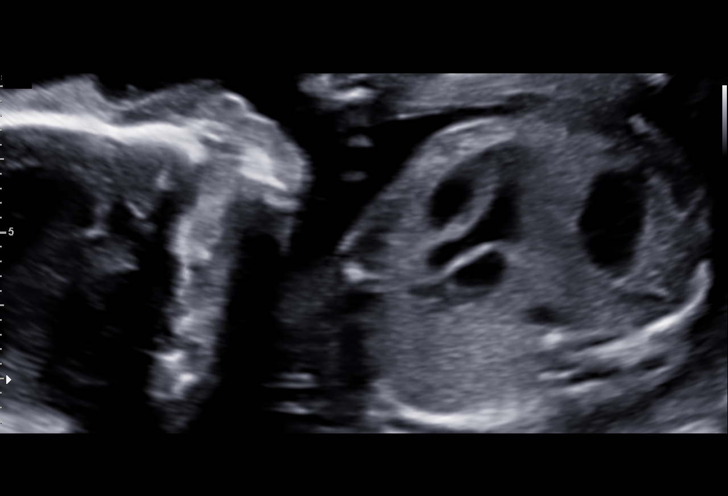
[im 70/100]
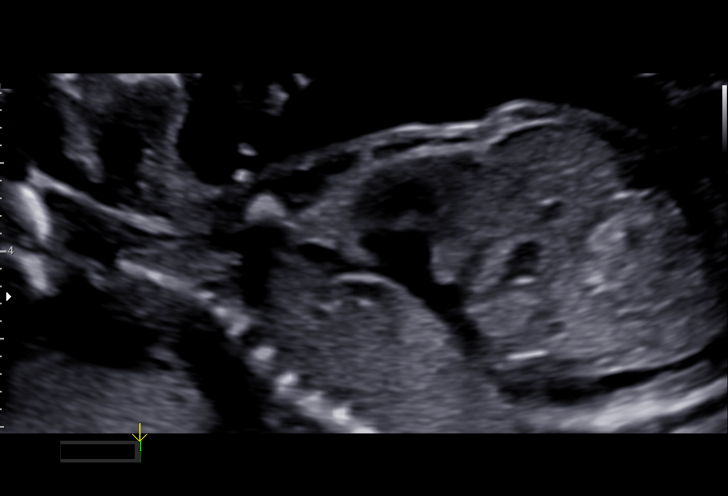
[im 78/100]
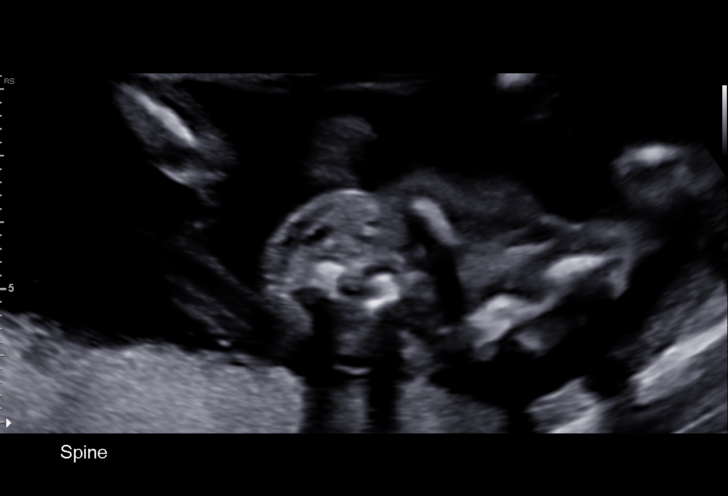
[im 85/100]
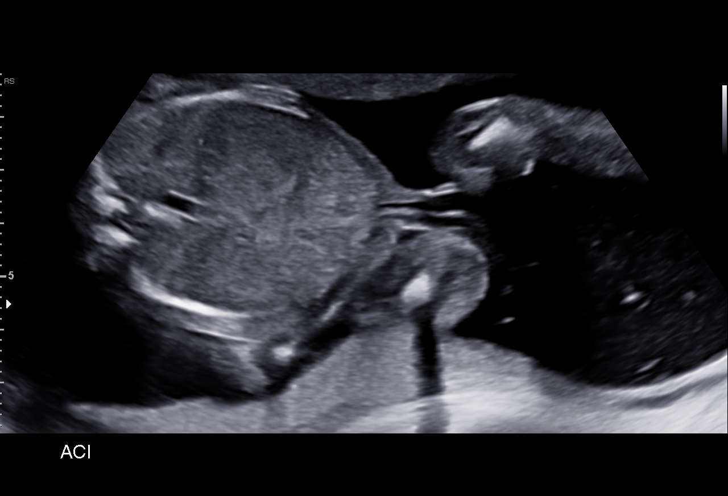
[im 92/100]
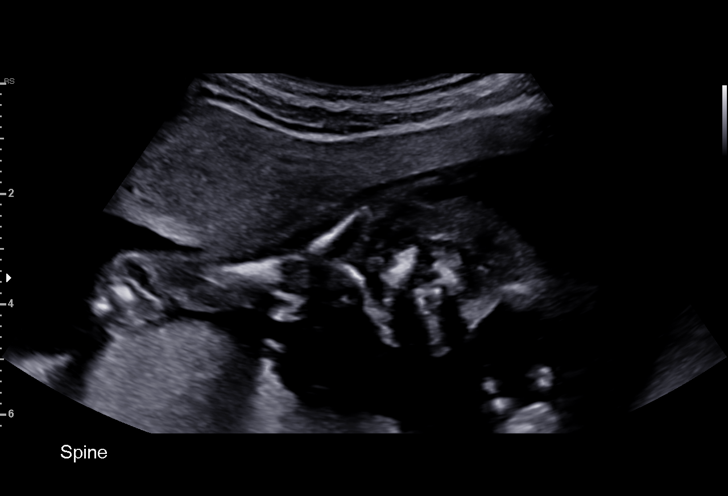
[im 100/100]
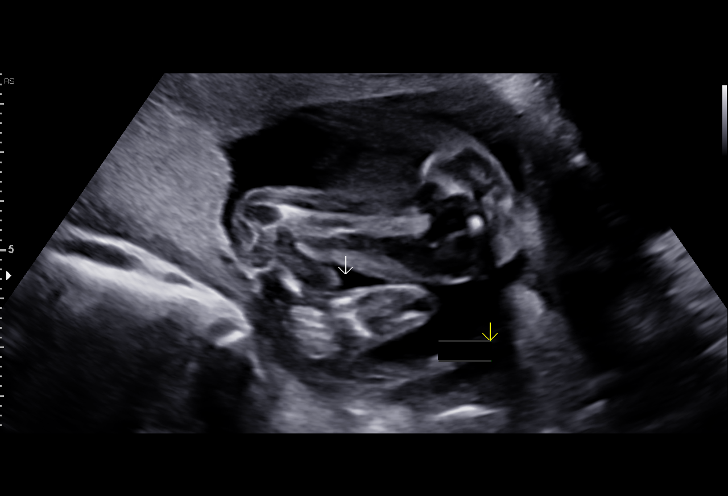

[14 of 28 positions shown; findings below may reference images not displayed]

Indications

19 weeks gestation of pregnancy
Encounter for antenatal screening for
malformations
OB History

Gravidity:    1
Fetal Evaluation

Num Of Fetuses:     1
Fetal Heart         159
Rate(bpm):
Cardiac Activity:   Observed
Presentation:       Variable
Placenta:           Posterior, above cervical os
P. Cord Insertion:  Visualized, central

Amniotic Fluid
AFI FV:      Subjectively within normal limits

Largest Pocket(cm)
4.96
Biometry

BPD:      46.3  mm     G. Age:  20w 0d         56  %    CI:        69.96   %    70 - 86
FL/HC:       16.8  %    16.8 -
HC:      176.6  mm     G. Age:  20w 1d         56  %    HC/AC:       1.17       1.09 -
AC:      150.3  mm     G. Age:  20w 2d         58  %    FL/BPD:      64.1  %
FL:       29.7  mm     G. Age:  19w 1d         20  %    FL/AC:       19.8  %    20 - 24
HUM:      30.8  mm     G. Age:  20w 2d         62  %
CER:      20.1  mm     G. Age:  19w 1d         34  %
NFT:       5.7  mm
CM:          4  mm

Est. FW:     314   gm    0 lb 11 oz     48  %
Gestational Age

LMP:           19w 6d        Date:  01/23/17                 EDD:   10/30/17
U/S Today:     19w 6d                                        EDD:   10/30/17
Best:          19w 6d     Det. By:  LMP  (01/23/17)          EDD:   10/30/17
Anatomy

Cranium:               Appears normal         Aortic Arch:            Appears normal
Cavum:                 Appears normal         Ductal Arch:            Appears normal
Ventricles:            Appears normal         Diaphragm:              Appears normal
Choroid Plexus:        Appears normal         Stomach:                Appears normal, left
sided
Cerebellum:            Appears normal         Abdomen:                Appears normal
Posterior Fossa:       Appears normal         Abdominal Wall:         Appears nml (cord
insert, abd wall)
Nuchal Fold:           Appears normal         Cord Vessels:           Appears normal (3
vessel cord)
Face:                  Orbits nl; profile not Kidneys:                Appear normal
well visualized
Lips:                  Appears normal         Bladder:                Appears normal
Thoracic:              Appears normal         Spine:                  Appears normal
Heart:                 Not well visualized    Upper Extremities:      Appears normal
RVOT:                  Appears normal         Lower Extremities:      Appears normal
LVOT:                  Appears normal

Other:  Fetus appears to be a male. Heels and left 5th digit visualized.
Technically difficult due to fetal position.
Cervix Uterus Adnexa

Cervix
Length:           3.01  cm.
Normal appearance by transabdominal scan.

Uterus
No abnormality visualized.

Left Ovary
Not visualized.

Right Ovary
Within normal limits.

Adnexa:       No abnormality visualized. No adnexal mass
visualized.
Impression

Singleton intrauterine pregnancy at 19+6 weeks here for
anatomic survey
Review of the anatomy shows no sonographic markers for
aneuploidy or structural anomalies
However, views of the fetal heart should be considered
suboptimal secondary to
Amniotic fluid volume is normal
Estimated fetal weight shows growth in the 48th percentile
Recommendations

Recommend follow-up ultrasound examnination in 4 weeks
for completion of the anatomic survey

## 2019-09-01 ENCOUNTER — Other Ambulatory Visit: Payer: Self-pay | Admitting: Obstetrics & Gynecology

## 2019-09-01 DIAGNOSIS — N921 Excessive and frequent menstruation with irregular cycle: Secondary | ICD-10-CM

## 2019-09-01 MED ORDER — NORETHIN ACE-ETH ESTRAD-FE 1-20 MG-MCG(24) PO TABS: 1.0000 | ORAL_TABLET | Freq: Every day | ORAL | 2 refills | Status: AC

## 2021-03-01 ENCOUNTER — Ambulatory Visit (INDEPENDENT_AMBULATORY_CARE_PROVIDER_SITE_OTHER): Payer: 59 | Admitting: Obstetrics & Gynecology

## 2021-03-01 ENCOUNTER — Other Ambulatory Visit: Payer: Self-pay

## 2021-03-01 ENCOUNTER — Encounter: Payer: Self-pay | Admitting: Obstetrics & Gynecology

## 2021-03-01 VITALS — BP 119/81 | HR 79 | Wt 176.0 lb

## 2021-03-01 DIAGNOSIS — Z01419 Encounter for gynecological examination (general) (routine) without abnormal findings: Secondary | ICD-10-CM

## 2021-03-01 DIAGNOSIS — Z3046 Encounter for surveillance of implantable subdermal contraceptive: Secondary | ICD-10-CM | POA: Diagnosis not present

## 2021-03-01 NOTE — Progress Notes (Signed)
Subjective:     Hailey Mckenzie is a 35 y.o. female here for a routine exam.  Current complaints: Pt reports that her Nexplanon was expired as of July. She would like to have it removed. Her fiance got a vasectomy so she does not have a need for contraception.  She reports that she has been attempting to lose weight but, has not been successful.      Gynecologic History No LMP recorded. Patient has had an implant. Contraception: Nexplanon expired 11/2020 Last Pap: 03/09/2019. Results were: normal Last mammogram: n/a.   Obstetric History OB History  Gravida Para Term Preterm AB Living  1 1   1   1   SAB IAB Ectopic Multiple Live Births        0 1    # Outcome Date GA Lbr Len/2nd Weight Sex Delivery Anes PTL Lv  1 Preterm 10/05/17 [redacted]w[redacted]d  4 lb 6.4 oz (1.995 kg) M CS-LTranv EPI  LIV    The following portions of the patient's history were reviewed and updated as appropriate: allergies, current medications, past family history, past medical history, past social history, past surgical history, and problem list.  Review of Systems Pertinent items are noted in HPI.    Objective:  BP 119/81   Pulse 79   Wt 176 lb (79.8 kg)   BMI 28.41 kg/m  General Appearance:    Alert, cooperative, no distress, appears stated age  Head:    Normocephalic, without obvious abnormality, atraumatic  Eyes:    conjunctiva/corneas clear, EOM's intact, both eyes  Ears:    Normal external ear canals, both ears  Nose:   Nares normal, septum midline, mucosa normal, no drainage    or sinus tenderness  Throat:   Lips, mucosa, and tongue normal; teeth and gums normal  Neck:   Supple, symmetrical, trachea midline, no adenopathy;    thyroid:  no enlargement/tenderness/nodules  Back:     Symmetric, no curvature, ROM normal, no CVA tenderness  Lungs:     respirations unlabored  Chest Wall:    No tenderness or deformity   Heart:    Regular rate and rhythm  Breast Exam:    No tenderness, masses, or nipple abnormality   Abdomen:     Soft, non-tender, bowel sounds active all four quadrants,    no masses, no organomegaly  Genitalia:    Normal female without lesion, discharge or tenderness     Extremities:   Extremities normal, atraumatic, no cyanosis or edema  Pulses:   2+ and symmetric all extremities  Skin:   Skin color, texture, turgor normal, no rashes or lesions   GYN procedure: Patient given informed consent, she signed consent form. Her Nexplanon expired in July.  She wants it removed.  Appropriate time out taken.  Patient's left arm was prepped and draped in the usual sterile fashion. Patient was prepped with alcohol swab and then injected with 3 ml of 1 % lidocaine.  She was prepped with betadine.  A #15 blade was used to make a small incision over the Nexplanon rod.  Using curved forceps, the end of the rod was grasped and the scar tissue was removed and the device was removed intact.  There was minimal blood loss. The incision site was covered with guaze and a pressure bandage to reduce any bruising.  The patient tolerated the procedure well and was given post procedure instructions.   Assessment:    Healthy female exam.  Nexplanon removal    Plan:  Diagnoses and all orders for this visit:  Well female exam with routine gynecological exam  Encounter for Nexplanon removal   F/u in 1 year or sooner prn   [name] L. Harraway-Smith, M.D., [place]

## 2021-03-03 ENCOUNTER — Encounter: Payer: Self-pay | Admitting: General Practice

## 2021-12-14 ENCOUNTER — Encounter: Payer: Self-pay | Admitting: General Practice
